# Patient Record
Sex: Male | Born: 1985 | Race: Black or African American | Hispanic: No | Marital: Single | Smoking: Former smoker
Health system: Southern US, Community
[De-identification: ages and names within clinical notes are randomized; demographics above are authoritative.]

## PROBLEM LIST (undated history)

## (undated) ENCOUNTER — Emergency Department (HOSPITAL_COMMUNITY): Discharge status: Absent without leave | Payer: Self-pay

## (undated) DIAGNOSIS — F329 Major depressive disorder, single episode, unspecified: Secondary | ICD-10-CM

## (undated) DIAGNOSIS — F32A Depression, unspecified: Secondary | ICD-10-CM

## (undated) HISTORY — DX: Major depressive disorder, single episode, unspecified: F32.9

## (undated) HISTORY — DX: Depression, unspecified: F32.A

---
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
## 2006-11-08 ENCOUNTER — Emergency Department (HOSPITAL_COMMUNITY): Admission: EM | Admit: 2006-11-08 | Discharge: 2006-11-08 | Payer: Self-pay | Admitting: Family Medicine

## 2011-01-15 ENCOUNTER — Inpatient Hospital Stay (HOSPITAL_COMMUNITY)
Admission: RE | Admit: 2011-01-15 | Discharge: 2011-01-15 | Disposition: A | Discharge status: Home / Self Care | Payer: Federal, State, Local not specified - PPO | Source: Ambulatory Visit | Attending: Emergency Medicine | Admitting: Emergency Medicine

## 2011-01-24 ENCOUNTER — Inpatient Hospital Stay (INDEPENDENT_AMBULATORY_CARE_PROVIDER_SITE_OTHER)
Admission: RE | Admit: 2011-01-24 | Discharge: 2011-01-24 | Disposition: A | Discharge status: Home / Self Care | Payer: Federal, State, Local not specified - PPO | Source: Ambulatory Visit | Attending: Family Medicine | Admitting: Family Medicine

## 2011-01-24 DIAGNOSIS — R109 Unspecified abdominal pain: Secondary | ICD-10-CM

## 2011-01-24 LAB — POCT URINALYSIS DIP (DEVICE)
Hgb urine dipstick: NEGATIVE
Ketones, ur: NEGATIVE mg/dL
Protein, ur: 30 mg/dL — AB
Specific Gravity, Urine: 1.025 (ref 1.005–1.030)

## 2015-09-18 ENCOUNTER — Emergency Department (HOSPITAL_COMMUNITY)
Admission: EM | Admit: 2015-09-18 | Discharge: 2015-09-18 | Disposition: A | Discharge status: Home / Self Care | Payer: Worker's Compensation | Attending: Emergency Medicine | Admitting: Emergency Medicine

## 2015-09-18 ENCOUNTER — Encounter (HOSPITAL_COMMUNITY): Payer: Self-pay | Admitting: Neurology

## 2015-09-18 DIAGNOSIS — F17200 Nicotine dependence, unspecified, uncomplicated: Secondary | ICD-10-CM | POA: Insufficient documentation

## 2015-09-18 DIAGNOSIS — S199XXA Unspecified injury of neck, initial encounter: Secondary | ICD-10-CM | POA: Insufficient documentation

## 2015-09-18 DIAGNOSIS — X58XXXA Exposure to other specified factors, initial encounter: Secondary | ICD-10-CM | POA: Insufficient documentation

## 2015-09-18 DIAGNOSIS — Y990 Civilian activity done for income or pay: Secondary | ICD-10-CM | POA: Insufficient documentation

## 2015-09-18 DIAGNOSIS — S39012A Strain of muscle, fascia and tendon of lower back, initial encounter: Secondary | ICD-10-CM | POA: Insufficient documentation

## 2015-09-18 DIAGNOSIS — Y92242 Post office as the place of occurrence of the external cause: Secondary | ICD-10-CM | POA: Insufficient documentation

## 2015-09-18 DIAGNOSIS — Y9389 Activity, other specified: Secondary | ICD-10-CM | POA: Insufficient documentation

## 2015-09-18 DIAGNOSIS — S29019A Strain of muscle and tendon of unspecified wall of thorax, initial encounter: Secondary | ICD-10-CM | POA: Insufficient documentation

## 2015-09-18 MED ORDER — IBUPROFEN 600 MG PO TABS: 600.0000 mg | ORAL_TABLET | Freq: Four times a day (QID) | ORAL | Status: DC | PRN

## 2015-09-18 MED ORDER — CYCLOBENZAPRINE HCL 10 MG PO TABS: 10.0000 mg | ORAL_TABLET | Freq: Two times a day (BID) | ORAL | Status: DC | PRN

## 2015-09-18 NOTE — ED Notes (Signed)
Called no answer

## 2015-09-18 NOTE — ED Provider Notes (Signed)
CSN: [id number]     Arrival date & time 09/18/15  1220 History  .By signing my name below, I, Dennis Moreno, attest that this documentation has been prepared under the direction and in the presence of Dennis Horseman, PA-C. Electronically Signed: Charline Moreno, ED Scribe 09/18/2015 at 2:24 PM.   Chief Complaint  Patient presents with  . Neck Pain   The history is provided by the patient.   HPI Comments: Dennis Moreno is a 30 y.o. male who presents to the Emergency Department with a chief complaint of gradually improving, intermittent neck pain onset 12 days ago. Pt states that he was working [place] the [employer] office on 09/06/15 and pushing a large wire box full of magazines when he felt a sudden, sharp sensation in his left neck and mid back. Pt states that constant pain has improved but he still experiences pain with movement. No medications tried PTA. He denies neck stiffness at this time.   History reviewed. No pertinent past medical history. History reviewed. No pertinent past surgical history. No family history on file. Social History  Substance Use Topics  . Smoking status: Current Every Day Smoker  . Smokeless tobacco: None  . Alcohol Use: Yes    Review of Systems  Constitutional: Negative for fever.  Musculoskeletal: Positive for neck pain. Negative for neck stiffness.   Allergies  Review of patient's allergies indicates no known allergies.  Home Medications   Prior to Admission medications   Not on File   BP 118/68 mmHg  Pulse 74  Temp(Src) 98 F (36.7 C) (Oral)  Resp 14  SpO2 100% Physical Exam  Constitutional: He is oriented to person, place, and time. He appears well-developed and well-nourished. No distress.  HENT:  Head: Normocephalic and atraumatic.  Eyes: Conjunctivae and EOM are normal. Right eye exhibits no discharge. Left eye exhibits no discharge. No scleral icterus.  Neck: Normal range of motion. Neck supple. No tracheal deviation present.   Cardiovascular: Normal rate, regular rhythm and normal heart sounds.  Exam reveals no gallop and no friction rub.   No murmur heard. Pulmonary/Chest: Effort normal and breath sounds normal. No respiratory distress. He has no wheezes.  Abdominal: Soft. He exhibits no distension. There is no tenderness.  Musculoskeletal: Normal range of motion.  Lumbar and thoracic paraspinal muscles tender to palpation, no bony tenderness, step-offs, or gross abnormality or deformity of spine, patient is able to ambulate, moves all extremities  Bilateral great toe extension intact Bilateral plantar/dorsiflexion intact  Neurological: He is alert and oriented to person, place, and time.  Sensation and strength intact bilaterally   Skin: Skin is warm and dry. He is not diaphoretic.  Psychiatric: He has a normal mood and affect. His behavior is normal. Judgment and thought content normal.  Nursing note and vitals reviewed.  ED Course  Procedures (including critical care time) DIAGNOSTIC STUDIES: Oxygen Saturation is 100% on RA, normal by my interpretation.    COORDINATION OF CARE: 2:11 PM-Discussed treatment plan which includes Flexeril and ibuprofen and follow up with a specialist if needed with pt at bedside and pt agreed to plan.     MDM   Final diagnoses:  Back strain, initial encounter    Patient with back pain.  No neurological deficits and normal neuro exam.  Patient is ambulatory.  No loss of bowel or bladder control.  Doubt cauda equina.  Denies fever,  doubt epidural abscess or other lesion. Recommend back exercises, stretching, RICE, and will treat with a  short course of ibuprofen and Flexeril.  Patient states that his symptoms have been gradually improving. However, when he moves in certain directions he still feels pain.  Encouraged the patient that there could be a need for additional workup and/or imaging such as MRI, if the symptoms do not resolve. Patient advised that if the back  pain does not resolve, or radiates, this could progress to more serious conditions and is encouraged to follow-up with PCP or orthopedics within 2 weeks.     I personally performed the services described in this documentation, which was scribed in my presence. The recorded information has been reviewed and is accurate.     Dennis Horsemanobert Shoshanah Dapper, PA-C 09/18/15 1431  Dennis Guiseana Duo Liu, MD 09/19/15 [phone]

## 2015-09-18 NOTE — ED Notes (Addendum)
Pt reports on 12/24, he was working at the [employer] office, and was pushing a wire box bundle of magazines. He then had pain to right and left side of neck and right and left middle back. Pain is better but still feels it when he moves in center directions. This is workman's comp.

## 2015-09-18 NOTE — Discharge Instructions (Signed)
Back Strain With Rehab A strain is an injury in which a tendon or muscle is torn. The muscles and tendons of the back are vulnerable to strains. However, these muscles and tendons are very strong and require a great force to be injured. Strains are classified into three categories. Grade 1 strains cause pain, but the tendon is not lengthened. Grade 2 strains include a lengthened ligament, due to the ligament being stretched or partially ruptured. With grade 2 strains there is still function, although the function may be decreased. Grade 3 strains involve a complete tear of the tendon or muscle, and function is usually impaired. SYMPTOMS   Pain in the lower back.  Pain that affects one side more than the other.  Pain that gets worse with movement and may be felt in the hip, buttocks, or back of the thigh.  Muscle spasms of the muscles in the back.  Swelling along the muscles of the back.  Loss of strength of the back muscles.  Crackling sound (crepitation) when the muscles are touched. CAUSES  Lower back strains occur when a force is placed on the muscles or tendons that is greater than they can handle. Common causes of injury include:  Prolonged overuse of the muscle-tendon units in the lower back, usually from incorrect posture.  A single violent injury or force applied to the back. RISK INCREASES WITH:  Sports that involve twisting forces on the spine or a lot of bending at the waist (football, rugby, weightlifting, bowling, golf, tennis, speed skating, racquetball, swimming, running, gymnastics, diving).  Poor strength and flexibility.  Failure to warm up properly before activity.  Family history of lower back pain or disk disorders.  Previous back injury or surgery (especially fusion).  Poor posture with lifting, especially heavy objects.  Prolonged sitting, especially with poor posture. PREVENTION   Learn and use proper posture when sitting or lifting (maintain proper  posture when sitting, lift using the knees and legs, not at the waist).  Warm up and stretch properly before activity.  Allow for adequate recovery between workouts.  Maintain physical fitness:  Strength, flexibility, and endurance.  Cardiovascular fitness. PROGNOSIS  If treated properly, lower back strains usually heal within 6 weeks. RELATED COMPLICATIONS   Recurring symptoms, resulting in a chronic problem.  Chronic inflammation, scarring, and partial muscle-tendon tear.  Delayed healing or resolution of symptoms.  Prolonged disability. TREATMENT  Treatment first involves the use of ice and medicine, to reduce pain and inflammation. The use of strengthening and stretching exercises may help reduce pain with activity. These exercises may be performed at home or with a therapist. Severe injuries may require referral to a therapist for further evaluation and treatment, such as ultrasound. Your caregiver may advise that you wear a back brace or corset, to help reduce pain and discomfort. Often, prolonged bed rest results in greater harm then benefit. Corticosteroid injections may be recommended. However, these should be reserved for the most serious cases. It is important to avoid using your back when lifting objects. At night, sleep on your back on a firm mattress with a pillow placed under your knees. If non-surgical treatment is unsuccessful, surgery may be needed.  MEDICATION   If pain medicine is needed, nonsteroidal anti-inflammatory medicines (aspirin and ibuprofen), or other minor pain relievers (acetaminophen), are often advised.  Do not take pain medicine for 7 days before surgery.  Prescription pain relievers may be given, if your caregiver thinks they are needed. Use only as directed and  only as much as you need.  Ointments applied to the skin may be helpful.  Corticosteroid injections may be given by your caregiver. These injections should be reserved for the most  serious cases, because they may only be given a certain number of times. HEAT AND COLD  Cold treatment (icing) should be applied for 10 to 15 minutes every 2 to 3 hours for inflammation and pain, and immediately after activity that aggravates your symptoms. Use ice packs or an ice massage.  Heat treatment may be used before performing stretching and strengthening activities prescribed by your caregiver, physical therapist, or athletic trainer. Use a heat pack or a warm water soak. SEEK MEDICAL CARE IF:   Symptoms get worse or do not improve in 2 to 4 weeks, despite treatment.  You develop numbness, weakness, or loss of bowel or bladder function.  New, unexplained symptoms develop. (Drugs used in treatment may produce side effects.) EXERCISES  RANGE OF MOTION (ROM) AND STRETCHING EXERCISES - Low Back Strain Most people with lower back pain will find that their symptoms get worse with excessive bending forward (flexion) or arching at the lower back (extension). The exercises which will help resolve your symptoms will focus on the opposite motion.  Your physician, physical therapist or athletic trainer will help you determine which exercises will be most helpful to resolve your lower back pain. Do not complete any exercises without first consulting with your caregiver. Discontinue any exercises which make your symptoms worse until you speak to your caregiver.  If you have pain, numbness or tingling which travels down into your buttocks, leg or foot, the goal of the therapy is for these symptoms to move closer to your back and eventually resolve. Sometimes, these leg symptoms will get better, but your lower back pain may worsen. This is typically an indication of progress in your rehabilitation. Be very alert to any changes in your symptoms and the activities in which you participated in the 24 hours prior to the change. Sharing this information with your caregiver will allow him/her to most efficiently  treat your condition.  These exercises may help you when beginning to rehabilitate your injury. Your symptoms may resolve with or without further involvement from your physician, physical therapist or athletic trainer. While completing these exercises, remember:  Restoring tissue flexibility helps normal motion to return to the joints. This allows healthier, less painful movement and activity.  An effective stretch should be held for at least 30 seconds.  A stretch should never be painful. You should only feel a gentle lengthening or release in the stretched tissue. FLEXION RANGE OF MOTION AND STRETCHING EXERCISES: STRETCH - Flexion, Single Knee to Chest   Lie on a firm bed or floor with both legs extended in front of you.  Keeping one leg in contact with the floor, bring your opposite knee to your chest. Hold your leg in place by either grabbing behind your thigh or at your knee.  Pull until you feel a gentle stretch in your lower back. Hold __________ seconds.  Slowly release your grasp and repeat the exercise with the opposite side. Repeat __________ times. Complete this exercise __________ times per day.  STRETCH - Flexion, Double Knee to Chest   Lie on a firm bed or floor with both legs extended in front of you.  Keeping one leg in contact with the floor, bring your opposite knee to your chest.  Tense your stomach muscles to support your back and then lift your  other knee to your chest. Hold your legs in place by either grabbing behind your thighs or at your knees.  Pull both knees toward your chest until you feel a gentle stretch in your lower back. Hold __________ seconds.  Tense your stomach muscles and slowly return one leg at a time to the floor. Repeat __________ times. Complete this exercise __________ times per day.  STRETCH - Low Trunk Rotation  Lie on a firm bed or floor. Keeping your legs in front of you, bend your knees so they are both pointed toward the ceiling  and your feet are flat on the floor.  Extend your arms out to the side. This will stabilize your upper body by keeping your shoulders in contact with the floor.  Gently and slowly drop both knees together to one side until you feel a gentle stretch in your lower back. Hold for __________ seconds.  Tense your stomach muscles to support your lower back as you bring your knees back to the starting position. Repeat the exercise to the other side. Repeat __________ times. Complete this exercise __________ times per day  EXTENSION RANGE OF MOTION AND FLEXIBILITY EXERCISES: STRETCH - Extension, Prone on Elbows   Lie on your stomach on the floor, a bed will be too soft. Place your palms about shoulder width apart and at the height of your head.  Place your elbows under your shoulders. If this is too painful, stack pillows under your chest.  Allow your body to relax so that your hips drop lower and make contact more completely with the floor.  Hold this position for __________ seconds.  Slowly return to lying flat on the floor. Repeat __________ times. Complete this exercise __________ times per day.  RANGE OF MOTION - Extension, Prone Press Ups  Lie on your stomach on the floor, a bed will be too soft. Place your palms about shoulder width apart and at the height of your head.  Keeping your back as relaxed as possible, slowly straighten your elbows while keeping your hips on the floor. You may adjust the placement of your hands to maximize your comfort. As you gain motion, your hands will come more underneath your shoulders.  Hold this position __________ seconds.  Slowly return to lying flat on the floor. Repeat __________ times. Complete this exercise __________ times per day.  RANGE OF MOTION- Quadruped, Neutral Spine   Assume a hands and knees position on a firm surface. Keep your hands under your shoulders and your knees under your hips. You may place padding under your knees for  comfort.  Drop your head and point your tail bone toward the ground below you. This will round out your lower back like an angry cat. Hold this position for __________ seconds.  Slowly lift your head and release your tail bone so that your back sags into a large arch, like an old horse.  Hold this position for __________ seconds.  Repeat this until you feel limber in your lower back.  Now, find your "sweet spot." This will be the most comfortable position somewhere between the two previous positions. This is your neutral spine. Once you have found this position, tense your stomach muscles to support your lower back.  Hold this position for __________ seconds. Repeat __________ times. Complete this exercise __________ times per day.  STRENGTHENING EXERCISES - Low Back Strain These exercises may help you when beginning to rehabilitate your injury. These exercises should be done near your "sweet spot." This  is the neutral, low-back arch, somewhere between fully rounded and fully arched, that is your least painful position. When performed in this safe range of motion, these exercises can be used for people who have either a flexion or extension based injury. These exercises may resolve your symptoms with or without further involvement from your physician, physical therapist or athletic trainer. While completing these exercises, remember:   Muscles can gain both the endurance and the strength needed for everyday activities through controlled exercises.  Complete these exercises as instructed by your physician, physical therapist or athletic trainer. Increase the resistance and repetitions only as guided.  You may experience muscle soreness or fatigue, but the pain or discomfort you are trying to eliminate should never worsen during these exercises. If this pain does worsen, stop and make certain you are following the directions exactly. If the pain is still present after adjustments, discontinue the  exercise until you can discuss the trouble with your caregiver. STRENGTHENING - Deep Abdominals, Pelvic Tilt  Lie on a firm bed or floor. Keeping your legs in front of you, bend your knees so they are both pointed toward the ceiling and your feet are flat on the floor.  Tense your lower abdominal muscles to press your lower back into the floor. This motion will rotate your pelvis so that your tail bone is scooping upwards rather than pointing at your feet or into the floor.  With a gentle tension and even breathing, hold this position for __________ seconds. Repeat __________ times. Complete this exercise __________ times per day.  STRENGTHENING - Abdominals, Crunches   Lie on a firm bed or floor. Keeping your legs in front of you, bend your knees so they are both pointed toward the ceiling and your feet are flat on the floor. Cross your arms over your chest.  Slightly tip your chin down without bending your neck.  Tense your abdominals and slowly lift your trunk high enough to just clear your shoulder blades. Lifting higher can put excessive stress on the lower back and does not further strengthen your abdominal muscles.  Control your return to the starting position. Repeat __________ times. Complete this exercise __________ times per day.  STRENGTHENING - Quadruped, Opposite UE/LE Lift   Assume a hands and knees position on a firm surface. Keep your hands under your shoulders and your knees under your hips. You may place padding under your knees for comfort.  Find your neutral spine and gently tense your abdominal muscles so that you can maintain this position. Your shoulders and hips should form a rectangle that is parallel with the floor and is not twisted.  Keeping your trunk steady, lift your right hand no higher than your shoulder and then your left leg no higher than your hip. Make sure you are not holding your breath. Hold this position __________ seconds.  Continuing to keep your  abdominal muscles tense and your back steady, slowly return to your starting position. Repeat with the opposite arm and leg. Repeat __________ times. Complete this exercise __________ times per day.  STRENGTHENING - Lower Abdominals, Double Knee Lift  Lie on a firm bed or floor. Keeping your legs in front of you, bend your knees so they are both pointed toward the ceiling and your feet are flat on the floor.  Tense your abdominal muscles to brace your lower back and slowly lift both of your knees until they come over your hips. Be certain not to hold your breath.  Hold  __________ seconds. Using your abdominal muscles, return to the starting position in a slow and controlled manner. Repeat __________ times. Complete this exercise __________ times per day.  POSTURE AND BODY MECHANICS CONSIDERATIONS - Low Back Strain Keeping correct posture when sitting, standing or completing your activities will reduce the stress put on different body tissues, allowing injured tissues a chance to heal and limiting painful experiences. The following are general guidelines for improved posture. Your physician or physical therapist will provide you with any instructions specific to your needs. While reading these guidelines, remember:  The exercises prescribed by your provider will help you have the flexibility and strength to maintain correct postures.  The correct posture provides the best environment for your joints to work. All of your joints have less wear and tear when properly supported by a spine with good posture. This means you will experience a healthier, less painful body.  Correct posture must be practiced with all of your activities, especially prolonged sitting and standing. Correct posture is as important when doing repetitive low-stress activities (typing) as it is when doing a single heavy-load activity (lifting). RESTING POSITIONS Consider which positions are most painful for you when choosing a  resting position. If you have pain with flexion-based activities (sitting, bending, stooping, squatting), choose a position that allows you to rest in a less flexed posture. You would want to avoid curling into a fetal position on your side. If your pain worsens with extension-based activities (prolonged standing, working overhead), avoid resting in an extended position such as sleeping on your stomach. Most people will find more comfort when they rest with their spine in a more neutral position, neither too rounded nor too arched. Lying on a non-sagging bed on your side with a pillow between your knees, or on your back with a pillow under your knees will often provide some relief. Keep in mind, being in any one position for a prolonged period of time, no matter how correct your posture, can still lead to stiffness. PROPER SITTING POSTURE In order to minimize stress and discomfort on your spine, you must sit with correct posture. Sitting with good posture should be effortless for a healthy body. Returning to good posture is a gradual process. Many people can work toward this most comfortably by using various supports until they have the flexibility and strength to maintain this posture on their own. When sitting with proper posture, your ears will fall over your shoulders and your shoulders will fall over your hips. You should use the back of the chair to support your upper back. Your lower back will be in a neutral position, just slightly arched. You may place a small pillow or folded towel at the base of your lower back for support.  When working at a desk, create an environment that supports good, upright posture. Without extra support, muscles tire, which leads to excessive strain on joints and other tissues. Keep these recommendations in mind: CHAIR:  A chair should be able to slide under your desk when your back makes contact with the back of the chair. This allows you to work closely.  The chair's  height should allow your eyes to be level with the upper part of your monitor and your hands to be slightly lower than your elbows. BODY POSITION  Your feet should make contact with the floor. If this is not possible, use a foot rest.  Keep your ears over your shoulders. This will reduce stress on your neck and lower  back. INCORRECT SITTING POSTURES  If you are feeling tired and unable to assume a healthy sitting posture, do not slouch or slump. This puts excessive strain on your back tissues, causing more damage and pain. Healthier options include:  Using more support, like a lumbar pillow.  Switching tasks to something that requires you to be upright or walking.  Talking a brief walk.  Lying down to rest in a neutral-spine position. PROLONGED STANDING WHILE SLIGHTLY LEANING FORWARD  When completing a task that requires you to lean forward while standing in one place for a long time, place either foot up on a stationary 2-4 inch high object to help maintain the best posture. When both feet are on the ground, the lower back tends to lose its slight inward curve. If this curve flattens (or becomes too large), then the back and your other joints will experience too much stress, tire more quickly, and can cause pain. CORRECT STANDING POSTURES Proper standing posture should be assumed with all daily activities, even if they only take a few moments, like when brushing your teeth. As in sitting, your ears should fall over your shoulders and your shoulders should fall over your hips. You should keep a slight tension in your abdominal muscles to brace your spine. Your tailbone should point down to the ground, not behind your body, resulting in an over-extended swayback posture.  INCORRECT STANDING POSTURES  Common incorrect standing postures include a forward head, locked knees and/or an excessive swayback. WALKING Walk with an upright posture. Your ears, shoulders and hips should all  line-up. PROLONGED ACTIVITY IN A FLEXED POSITION When completing a task that requires you to bend forward at your waist or lean over a low surface, try to find a way to stabilize 3 out of 4 of your limbs. You can place a hand or elbow on your thigh or rest a knee on the surface you are reaching across. This will provide you more stability so that your muscles do not fatigue as quickly. By keeping your knees relaxed, or slightly bent, you will also reduce stress across your lower back. CORRECT LIFTING TECHNIQUES DO :   Assume a wide stance. This will provide you more stability and the opportunity to get as close as possible to the object which you are lifting.  Tense your abdominals to brace your spine. Bend at the knees and hips. Keeping your back locked in a neutral-spine position, lift using your leg muscles. Lift with your legs, keeping your back straight.  Test the weight of unknown objects before attempting to lift them.  Try to keep your elbows locked down at your sides in order get the best strength from your shoulders when carrying an object.  Always ask for help when lifting heavy or awkward objects. INCORRECT LIFTING TECHNIQUES DO NOT:   Lock your knees when lifting, even if it is a small object.  Bend and twist. Pivot at your feet or move your feet when needing to change directions.  Assume that you can safely pick up even a paper clip without proper posture.   This information is not intended to replace advice given to you by your health care provider. Make sure you discuss any questions you have with your health care provider.   Document Released: 08/30/2005 Document Revised: 09/20/2014 Document Reviewed: 12/12/2008 [employer] Interactive Patient Education [employer]

## 2015-09-18 NOTE — ED Notes (Signed)
Declined W/C at D/C and was escorted to lobby by RN. 

## 2015-09-29 ENCOUNTER — Emergency Department (HOSPITAL_COMMUNITY)
Admission: EM | Admit: 2015-09-29 | Discharge: 2015-09-29 | Disposition: A | Discharge status: Home / Self Care | Payer: Federal, State, Local not specified - PPO | Attending: Emergency Medicine | Admitting: Emergency Medicine

## 2015-09-29 ENCOUNTER — Encounter (HOSPITAL_COMMUNITY): Payer: Self-pay | Admitting: *Deleted

## 2015-09-29 DIAGNOSIS — X500XXD Overexertion from strenuous movement or load, subsequent encounter: Secondary | ICD-10-CM | POA: Insufficient documentation

## 2015-09-29 DIAGNOSIS — S238XXD Sprain of other specified parts of thorax, subsequent encounter: Secondary | ICD-10-CM | POA: Insufficient documentation

## 2015-09-29 DIAGNOSIS — F17200 Nicotine dependence, unspecified, uncomplicated: Secondary | ICD-10-CM | POA: Insufficient documentation

## 2015-09-29 DIAGNOSIS — S161XXD Strain of muscle, fascia and tendon at neck level, subsequent encounter: Secondary | ICD-10-CM | POA: Diagnosis not present

## 2015-09-29 DIAGNOSIS — S239XXD Sprain of unspecified parts of thorax, subsequent encounter: Secondary | ICD-10-CM

## 2015-09-29 DIAGNOSIS — M542 Cervicalgia: Secondary | ICD-10-CM | POA: Diagnosis present

## 2015-09-29 MED ORDER — METHOCARBAMOL 500 MG PO TABS: 500.0000 mg | ORAL_TABLET | Freq: Two times a day (BID) | ORAL | Status: DC | PRN

## 2015-09-29 MED ORDER — IBUPROFEN 400 MG PO TABS
800.0000 mg | ORAL_TABLET | Freq: Once | ORAL | Status: AC
  Administered 2015-09-29: 800 mg via ORAL
  Filled 2015-09-29: qty 2

## 2015-09-29 NOTE — ED Notes (Signed)
Declined W/C at D/C and was escorted to lobby by RN. 

## 2015-09-29 NOTE — Discharge Instructions (Signed)
For pain control you may take up to 800mg  of Motrin (also known as ibuprofen). That is usually 4 over the counter pills,  3 times a day. Take with food to minimize stomach irritation   For breakthrough pain you may take Robaxin. Do not drink alcohol, drive or operate heavy machinery when taking Robaxin.  Do not hesitate to return to the emergency room for any new, worsening or concerning symptoms.  Please obtain primary care using resource guide below. Let them know that you were seen in the emergency room and that they will need to obtain records for further outpatient management.    Emergency Department Resource Guide 1) Find a Doctor and Pay Out of Pocket Although you won't have to find out who is covered by your insurance plan, it is a good idea to ask around and get recommendations. You will then need to call the office and see if the doctor you have chosen will accept you as a new patient and what types of options they offer for patients who are self-pay. Some doctors offer discounts or will set up payment plans for their patients who do not have insurance, but you will need to ask so you aren't surprised when you get to your appointment.  2) Contact Your Local Health Department Not all health departments have doctors that can see patients for sick visits, but many do, so it is worth a call to see if yours does. If you don't know where your local health department is, you can check in your phone book. The CDC also has a tool to help you locate your state's health department, and many state websites also have listings of all of their local health departments.  3) Find a Walk-in Clinic If your illness is not likely to be very severe or complicated, you may want to try a walk in clinic. These are popping up all over the country in pharmacies, drugstores, and shopping centers. They're usually staffed by nurse practitioners or physician assistants that have been trained to treat common illnesses  and complaints. They're usually fairly quick and inexpensive. However, if you have serious medical issues or chronic medical problems, these are probably not your best option.  No Primary Care Doctor: - Call Health Connect at  [phone] - they can help you locate a primary care doctor that  accepts your insurance, provides certain services, etc. - Physician Referral Service- [phone]  Chronic Pain Problems: Organization         Address  Phone   Notes  [facility]  [phone] Patients need to be referred by their primary care doctor.   Medication Assistance: Organization         Address  Phone   Notes  [facility]ication [facility] [address] [place], [place] [address] [phone] --Must be a resident of [place] -- Must have NO insurance coverage whatsoever (no Medicaid/ Medicare, etc.) -- The pt. MUST have a primary care doctor that directs their care regularly and follows them in the community   MedAssist  [phone]   [employer]  [phone]    Agencies that provide inexpensive medical care: Organization         Address  Phone   Notes  [facility]  [phone]   [facility]    [phone]   [facility] [address] [place], [place] [address] [phone]  [facility] [address] [place] [phone]   Planned Parenthood    [phone]   [facility]    [phone]   [place] and [place] [address], [place] Phone:  [phone], Fax:  [phone] Hours of Operation:  9 am - 6 pm, M-F.  Also accepts Medicaid/Medicare and self-pay.  [facility] for [place] [address], [address], [place] Phone: [phone], Fax: [phone]. Hours of Operation:  8:30 am - 5:30 pm, M-F.  Also accepts Medicaid and self-pay.  [facility] [place] [address] [place] Phone: [phone]   [place], [place], [place] [phone], Ext. [phone] Mondays & Thursdays: 7-9 AM.  First 15 patients are seen on a first come, first serve basis.    [place] Providers:  Organization         Address  Phone   Notes  [facility] [address], [place] [phone] Also accepts self-pay patients.  [facility] [address], [place]  [phone]   [place], [address], [place] [phone]   [facility] [address], [place] [phone]   Lucianne Lei [address], [place]   [phone] Only accepts [place] Access [place] patients after they have their name applied to their card.   Self-Pay (no insurance) in [facility] [place]:  Organization         Address  Phone   Notes  Sickle Cell Patients, [facility] [place] [phone]   [facility] [place] [phone]   [name] [place]  [place], [place], [place] [phone]   [facility]/Dr. Osei-Bonsu  [address] [place] or [address], [place] [phone] Phone number for both [place] and [place] locations is the same.  Urgent Medical and [facility] [address] [place] [phone]   [facility] [address] [place] or [address] [phone]   [facility] [address], [place] [phone], phone; [phone], fax Sees patients 1st and 3rd Saturday of every month.  Must not qualify for public or private insurance (i.e. Medicaid, Medicare, [place] Health Choice, Veterans' Benefits)  Household income should be no more than 200% of the poverty level  The clinic cannot treat you if you are pregnant or think you are pregnant  Sexually transmitted diseases are not treated at the clinic.    Dental Care: Organization         Address  Phone  Notes  [facility] [place] [phone] Accepts children up to age 97 who are enrolled in [place] or [place]; pregnant women with a Medicaid card; and children who have applied for Medicaid or [place] Health Choice, but were declined, whose parents can pay a reduced fee at time of service.  [facility]  [address], [place] [phone] Accepts children up to age 11 who are enrolled in [place] or [place]; pregnant women with a Medicaid card; and  children who have applied for Medicaid or [place] Health Choice, but were declined, whose parents can pay a reduced fee at time of service.  [place] Adult [facility] Access PROGRAM  [place] [phone] Patients are seen by appointment only. Walk-ins are not accepted. [place] will see patients 65 years of age and older. Monday - Tuesday (8am-5pm) Most Wednesdays (8:30-5pm) $30 per visit, cash only  [facility] [place] Access PRO[facility]  [address], [facility] [phone] Patients are seen by appointment only. Walk-ins are not accepted. [place] will see patients 17 years of age and older. One Wednesday Evening (Monthly: Volunteer Based).  $30 per visit, cash only  [place]  [phone] for adults; Children under age 53, call Graduate Pediatric Dentistry at [phone]. Children aged 31-14, please call [phone] to request a pediatric application.  Dental services are provided in all areas of dental care including fillings, crowns and bridges, complete and partial dentures, implants, gum treatment, root canals, and extractions. Preventive care is  also provided. Treatment is provided to both adults and children. Patients are selected via a lottery and there is often a waiting list.   [facility] [address] [place]  [phone] [email or web address]   [facility] [address] [place], [place] [phone], Ext. [phone] Second and Fourth Thursday of each month, opens at 6:30 AM; Clinic ends at 9 AM.  Patients are seen on a first-come first-served basis, and a limited number are seen during each clinic.   [facility]  [address] [place] D[place]er [place], [place] [phone]   Eligibility Requirements You must have lived in [place], [place], or [place] counties for at least the last three months.   You cannot be eligible for state or federal sponsored [employer], including [employer], [place], or [employer].   You generally cannot be eligible for healthcare insurance through your employer.    How to apply: Eligibility screenings are held every Tuesday and Wednesday afternoon from 1:00 pm until 4:00 pm. You do not need an appointment for the interview!  [facility] [address] [place], [place]   [place]  [place] Department  [place]  [phone]    Behavioral Health Resources in the Community: Intensive Outpatient Programs Organization         Address  Phone  Notes  [place]. [address] [place], [place] [phone]   [facility]patient [address], [place], [place]   ADS: Alcohol & Drug Svcs [address] [place], [place]   [place] [address],  [place], [place] or [phone]   Substance Abuse Resources Organization         Address  Phone  Notes  Alcohol and Drug Services  [phone]   [place]  [phone]   The [place]   Chinita Pester  [phone]   Residential & Outpatient Substance Abuse Program  [phone]   Psychological Services Organization         Address  Phone  Notes  [facility] [place]  [place]  [phone]   [place] [address], [place] or [phone]    Mobile Crisis Teams Organization  Address  Phone  Notes  [facility], Mobile Crisis Care Unit  [phone]   [facility]  [address] [place], [place] [phone]   [facility] [address] [place] [phone]    Self-Help/Support Groups Organization         Address  Phone             Notes  [facility] of [place] - variety of support groups  [phone] Call for more information  Narcotics Anonymous (NA), Caring [facility] [address], [place]  2 meetings at this location   Statisticianesidential Treatment Programs Organization         Address  Phone  Notes  [facility] [address],    [place]  [phone]   [facility]  [address], [place] [phone], [place], [place] [phone]   [facility] [address], [place] [phone] Admissions: 8am-3pm M-F  [facility] [address]    [place], [place] [phone]   [facility] [address], [place], [place] [phone]   [facility] [address]  [place], [place] [phone]   [facility] - Intensive Outpatient [address], [place], [place] [phone]   [facility] ([facility] Recovery [facility] [address]  [place], [place] [phone] or [phone]   [facility] (RTS) [address] [place], [place] [phone] Accepts Medicaid  [facility] [place] [address]  [place] [phone]  Substance Abuse/Addiction Treatment   [facility] Behavioral Health Resources Organization         Address  Phone  Notes  [facility]  [phone](888) [phone]   Angie FavaJulie Brannon, PhD [address] [place], [place]   [phone] or [phone]) [phone]   [facility] [place]   [address] [place], [place] [phone](336) [phone]   [facility] [address], [place], [place] [phone]) [phone] Insurance/Medicaid/sponsorship through [facility]  [facility] Families [address]                                    [place], [place] [phone]) [phone] Therapy/tele-psych/case  [facility] [address].   [place], [place] [phone]) [phone]    Dr. Lolly MustacheArfeen  [phone]) [phone]   [facility] [place]  United Way [facility] 1) [address], [place] 2) [address], [place] 3)  [address], [place] [phone]) [phone]  [phone](336) [phone]   [facility] Child Abuse Hotline [phone]) [phone] or [phone]) [phone] (After Hours)

## 2015-09-29 NOTE — ED Notes (Signed)
PT reports ongoing pain to nedck and back. Pt reports Pain first started 3-4 weeks ago . Pt reports he injured his neck and back at work . Pain is 8/10 at rest and increases to 10/10 with activity. Py took a week off work but pain has not improved.

## 2015-09-29 NOTE — ED Provider Notes (Signed)
History  By signing my name below, I, Karle PlumberJennifer Tensley, attest that this documentation has been prepared under the direction and in the presence of [employer] States [employer] Corporationicole Leeona Mccardle, PA-C. Electronically Signed: Karle PlumberJennifer Tensley, ED Scribe. 09/29/2015. 9:58 AM.  Chief Complaint  Patient presents with  . Back Pain  . Neck Pain   The history is provided by the patient and medical records. No language interpreter was used.    HPI Comments:  Dennis Moreno is a 30 y.o. male who presents to the Emergency Department complaining of worsening neck and mid back pain that started secondary to moving a heavy wire cage 3-4 weeks ago while at work. He states the cage weighs about 1000 pounds and is on wheels. He states he was pushing and pulling the cage trying to move it. He was seen here about 11 days ago and was given a referral to the orthopedist but has not been able to follow up due to his job not giving him his claim number for worker's comp. He rates the pain at 3-5/10 at rest and 10/10 when active. He has not been taking anything for pain but tries to use yoga to reduce the pain. He denies modifying factors. He denies numbness, tingling or weakness of any extremity, nausea, vomiting, fever or chills.   History reviewed. No pertinent past medical history. History reviewed. No pertinent past surgical history. History reviewed. No pertinent family history. Social History  Substance Use Topics  . Smoking status: Current Every Day Smoker  . Smokeless tobacco: None  . Alcohol Use: Yes    Review of Systems A complete 10 system review of systems was obtained and all systems are negative except as noted in the HPI and PMH.   Allergies  Review of patient's allergies indicates no known allergies.  Home Medications   Prior to Admission medications   Medication Sig Start Date End Date Taking? Authorizing Provider  cyclobenzaprine (FLEXERIL) 10 MG tablet Take 1 tablet (10 mg total) by mouth 2 (two) times daily  as needed for muscle spasms. 09/18/15  Yes Roxy Horsemanobert Browning, PA-C  ibuprofen (ADVIL,MOTRIN) 600 MG tablet Take 1 tablet (600 mg total) by mouth every 6 (six) hours as needed. 09/18/15  Yes Roxy Horsemanobert Browning, PA-C   Triage Vitals: BP 114/55 mmHg  Pulse 74  Temp(Src) 98.6 F (37 C) (Oral)  Resp 16  Ht 5\' 9"  (1.753 m)  Wt 145 lb (65.772 kg)  BMI 21.40 kg/m2  SpO2 100% Physical Exam  Constitutional: He appears well-developed and well-nourished.  HENT:  Head: Normocephalic.  Eyes: Conjunctivae are normal.  Neck: Normal range of motion.  No midline C-spine  tenderness to palpation or step-offs appreciated. Patient has full range of motion without pain.  Grip strength, biceps, triceps 5/5 bilaterally;  can differentiate between pinprick and light touch bilaterally.   Cardiovascular: Normal rate, regular rhythm and intact distal pulses.   Pulmonary/Chest: Effort normal.  Abdominal: Soft. There is no tenderness.  Neurological: He is alert.  + TTP of paraspinal musculature on the thoracic back. No point tenderness to percussion of lumbar spinal processes.  No TTP or paraspinal muscular spasm. Strength is 5 out of 5 to bilateral lower extremities at hip and knee; extensor hallucis longus 5 out of 5. Ankle strength 5 out of 5, no clonus, neurovascularly intact. No saddle anaesthesia. Patellar reflexes are 2+ bilaterally.     Psychiatric: He has a normal mood and affect.  Nursing note and vitals reviewed.   ED Course  Procedures (including  critical care time) DIAGNOSTIC STUDIES: Oxygen Saturation is 100% on RA, normal by my interpretation.   COORDINATION OF CARE: 9:55 AM- Will prescribe muscle relaxer and Motrin and give referral to orthopedist. Pt verbalizes understanding and agrees to plan.  Medications - No data to display   MDM   Final diagnoses:  Cervical strain, acute, subsequent encounter    Filed Vitals:   09/29/15 0949  BP: 114/55  Pulse: 74  Temp: 98.6 F (37 C)   TempSrc: Oral  Resp: 16  Height: 5\' 9"  (1.753 m)  Weight: 65.772 kg  SpO2: 100%    Medications  ibuprofen (ADVIL,MOTRIN) tablet 800 mg (800 mg Oral Given 09/29/15 1022)    Dennis Moreno is 31 y.o. male presenting with persistent thoracic and cervical discomfort after patient was pushing a heavy object approximately 1 months ago. There was no direct trauma. No indication for imaging. Physical exam with no significant abnormality. Patient is given a prescription for Robaxin and advised to take high-dose ibuprofen. Orthopedic referral given.  Evaluation does not show pathology that would require ongoing emergent intervention or inpatient treatment. Pt is hemodynamically stable and mentating appropriately. Discussed findings and plan with patient/guardian, who agrees with care plan. All questions answered. Return precautions discussed and outpatient follow up given.   Discharge Medication List as of 09/29/2015 10:06 AM    START taking these medications   Details  methocarbamol (ROBAXIN) 500 MG tablet Take 1 tablet (500 mg total) by mouth 2 (two) times daily as needed for muscle spasms., Starting 09/29/2015, Until Discontinued, Print            Wynetta Emery, PA-C 09/29/15 1636  Marily Memos, MD 10/01/15 0700

## 2015-10-06 ENCOUNTER — Encounter (HOSPITAL_COMMUNITY): Payer: Self-pay

## 2015-10-06 ENCOUNTER — Emergency Department (HOSPITAL_COMMUNITY)
Admission: EM | Admit: 2015-10-06 | Discharge: 2015-10-06 | Disposition: A | Discharge status: Home / Self Care | Payer: Federal, State, Local not specified - PPO | Attending: Emergency Medicine | Admitting: Emergency Medicine

## 2015-10-06 DIAGNOSIS — Z87828 Personal history of other (healed) physical injury and trauma: Secondary | ICD-10-CM | POA: Insufficient documentation

## 2015-10-06 DIAGNOSIS — M545 Low back pain: Secondary | ICD-10-CM

## 2015-10-06 DIAGNOSIS — F17200 Nicotine dependence, unspecified, uncomplicated: Secondary | ICD-10-CM | POA: Insufficient documentation

## 2015-10-06 DIAGNOSIS — M5440 Lumbago with sciatica, unspecified side: Secondary | ICD-10-CM | POA: Insufficient documentation

## 2015-10-06 DIAGNOSIS — M542 Cervicalgia: Secondary | ICD-10-CM | POA: Insufficient documentation

## 2015-10-06 NOTE — ED Provider Notes (Signed)
CSN: [id number]     Arrival date & time 10/06/15  1157 History  By signing my name below, I, [name], attest that this documentation has been prepared under the direction and in the presence of Roxy Horseman, PA-C Electronically Signed: Charline Bills, ED Scribe 10/06/2015 at 1:34 PM.   Chief Complaint  Patient presents with  . neck and back pain    The history is provided by the patient. No language interpreter was used.   HPI Comments: FORTUNE BRANNIGAN is a 30 y.o. male who presents to the Emergency Department with a chief complaint of constant, unchanged neck pain for the past month. Pt initially injured his neck while at work on 09/06/15 when pushing a large wire box full of magazines. He reports a sudden, sharp sensation in his left neck and mid back at that time. He states that he is still having pain even with light duty. Pt was referred to the back specialist but states that he could not make an appointment since he did not have a case number at that time. No other complaints reported at this time.    History reviewed. No pertinent past medical history. History reviewed. No pertinent past surgical history. No family history on file. Social History  Substance Use Topics  . Smoking status: Current Every Day Smoker  . Smokeless tobacco: None  . Alcohol Use: Yes    Review of Systems  Musculoskeletal: Positive for back pain and neck pain.   Allergies  Review of patient's allergies indicates no known allergies.  Home Medications   Prior to Admission medications   Medication Sig Start Date End Date Taking? Authorizing Provider  cyclobenzaprine (FLEXERIL) 10 MG tablet Take 1 tablet (10 mg total) by mouth 2 (two) times daily as needed for muscle spasms. 09/18/15   Roxy Horseman, PA-C  ibuprofen (ADVIL,MOTRIN) 600 MG tablet Take 1 tablet (600 mg total) by mouth every 6 (six) hours as needed. 09/18/15   Roxy Horseman, PA-C  methocarbamol (ROBAXIN) 500 MG tablet Take 1 tablet  (500 mg total) by mouth 2 (two) times daily as needed for muscle spasms. 09/29/15   Nicole Pisciotta, PA-C   BP 114/92 mmHg  Pulse 90  Temp(Src) 98.7 F (37.1 C) (Oral)  Resp 18  SpO2 100% Physical Exam  Constitutional: He is oriented to person, place, and time. He appears well-developed and well-nourished. No distress.  HENT:  Head: Normocephalic and atraumatic.  Eyes: Conjunctivae and EOM are normal. Right eye exhibits no discharge. Left eye exhibits no discharge. No scleral icterus.  Neck: Normal range of motion. Neck supple. No tracheal deviation present.  Cardiovascular: Normal rate.   Pulmonary/Chest: Effort normal. No respiratory distress.  Abdominal: Soft. He exhibits no distension.  Musculoskeletal: Normal range of motion.  No gross abnormality or deformity of spine, patient is able to ambulate, moves all extremities  Bilateral great toe extension intact Bilateral plantar/dorsiflexion intact  Neurological: He is alert and oriented to person, place, and time.  Sensation and strength intact bilaterally   Skin: Skin is warm and dry. He is not diaphoretic.  Psychiatric: He has a normal mood and affect. His behavior is normal. Judgment and thought content normal.  Nursing note and vitals reviewed.  ED Course  Procedures (including critical care time) DIAGNOSTIC STUDIES: Oxygen Saturation is 100% on RA, normal by my interpretation.    COORDINATION OF CARE: 1:32 PM-Discussed treatment plan which includes f/u with back specialist with pt at bedside and pt agreed to plan.  MDM   Final diagnoses:  Low back pain, unspecified back pain laterality, with sciatica presence unspecified    Patient with low back pain following an injury back in December when he was pulling a heavy metal cage. He states that he has not seen the back doctor. States that he is having trouble with his paperwork. He denies any new injuries or symptoms. I have stressed to the patient that he needs to  see the back specialist for further workup and evaluation as there is no evidence of emergent process today.  I personally performed the services described in this documentation, which was scribed in my presence. The recorded information has been reviewed and is accurate.      Roxy Horseman, PA-C 10/06/15 1338  Blane Ohara, MD 10/06/15 [phone]

## 2015-10-06 NOTE — Discharge Instructions (Signed)

## 2015-10-06 NOTE — ED Notes (Signed)
Patient here for further evaluation for ongoing lumbar back pain and neck pain, seen x 2 for same, denies any new trauma. Reports that he originally injured at work from lifting

## 2015-10-06 NOTE — ED Notes (Signed)
Declined W/C at D/C and was escorted to lobby by RN. 

## 2015-10-27 ENCOUNTER — Emergency Department (HOSPITAL_COMMUNITY)
Admission: EM | Admit: 2015-10-27 | Discharge: 2015-10-27 | Disposition: A | Discharge status: Home / Self Care | Payer: Federal, State, Local not specified - PPO | Attending: Emergency Medicine | Admitting: Emergency Medicine

## 2015-10-27 ENCOUNTER — Encounter (HOSPITAL_COMMUNITY): Payer: Self-pay | Admitting: Emergency Medicine

## 2015-10-27 DIAGNOSIS — F17200 Nicotine dependence, unspecified, uncomplicated: Secondary | ICD-10-CM | POA: Diagnosis not present

## 2015-10-27 DIAGNOSIS — M546 Pain in thoracic spine: Secondary | ICD-10-CM | POA: Insufficient documentation

## 2015-10-27 MED ORDER — NAPROXEN 500 MG PO TABS: 500.0000 mg | ORAL_TABLET | Freq: Two times a day (BID) | ORAL | Status: DC

## 2015-10-27 MED ORDER — CYCLOBENZAPRINE HCL 10 MG PO TABS: 10.0000 mg | ORAL_TABLET | Freq: Two times a day (BID) | ORAL | Status: DC | PRN

## 2015-10-27 NOTE — ED Notes (Signed)
Pt states for the last 2 months he has had back pain and spasms in his back after an injury at work. In December injured his back pulling and pushing a heavy cart. Pt denies any numbness or tingling in legs.

## 2015-10-27 NOTE — Discharge Instructions (Signed)

## 2015-10-27 NOTE — ED Provider Notes (Signed)
CSN: [id number]     Arrival date & time 10/27/15  1535 History  By signing my name below, I, Dennis Moreno, attest that this documentation has been prepared under the direction and in the presence of [name], NP. Electronically Signed: Elon Moreno ED Scribe. 10/27/2015. 5:23 PM.    Chief Complaint  Patient presents with  . Back Pain   The history is provided by the patient. No language interpreter was used.   HPI Comments: Dennis Moreno is a 30 y.o. male who presents to the Emergency Department complaining of waxing and waning, moderate mid-low back pain that intermittently shoots "to the back of my ears" onset two months ago.  He reports the pain is worsened with long periods of sitting or standing as well as strenuous activity.  He reports sleeping in his car on a heated seat for tx as well as drinking tea.  Per chart review, the patient was pushing a box full of wires at work when the pain onset.  He has been seen in the ED multiple times for this complaint where he was prescribed robaxin, flexeril, and ibuprofen and eferred to [employer].  However, he has not taken any medications because "I don't take pharmaceuticals" and has not f/u with specialist because of workman's compensation claim issues.  He reports  "I keep coming here so I can get proof at work so I don't get disciplined at work."  He denies bowel/bladder incontinence, lower extremity weakness/numbness.  `  History reviewed. No pertinent past medical history. History reviewed. No pertinent past surgical history. No family history on file. Social History  Substance Use Topics  . Smoking status: Current Every Day Smoker  . Smokeless tobacco: None  . Alcohol Use: Yes    Review of Systems  Musculoskeletal: Positive for back pain.  All other systems reviewed and are negative.  A complete 10 system review of systems was obtained and all systems are negative except as noted in the HPI and PMH.   Allergies   Review of patient's allergies indicates no known allergies.  Home Medications   Prior to Admission medications   Medication Sig Start Date End Date Taking? Authorizing Provider  cyclobenzaprine (FLEXERIL) 10 MG tablet Take 1 tablet (10 mg total) by mouth 2 (two) times daily as needed for muscle spasms. 09/18/15   Roxy Horseman, PA-C  ibuprofen (ADVIL,MOTRIN) 600 MG tablet Take 1 tablet (600 mg total) by mouth every 6 (six) hours as needed. 09/18/15   Roxy Horseman, PA-C  methocarbamol (ROBAXIN) 500 MG tablet Take 1 tablet (500 mg total) by mouth 2 (two) times daily as needed for muscle spasms. 09/29/15   Nicole Pisciotta, PA-C   BP 131/76 mmHg  Pulse 99  Temp(Src) 98 F (36.7 C) (Oral)  Resp 16  SpO2 100% Physical Exam  Constitutional: He is oriented to person, place, and time. He appears well-developed and well-nourished. No distress.  HENT:  Head: Normocephalic and atraumatic.  Eyes: Conjunctivae and EOM are normal.  Neck: Neck supple. No tracheal deviation present.  Cardiovascular: Normal rate.   Pulmonary/Chest: Effort normal. No respiratory distress.  Musculoskeletal: Normal range of motion.  TTP of low thoracic spine.  No step-offs or deformity  No strength deficits.   Neurological: He is alert and oriented to person, place, and time.  Skin: Skin is warm and dry.  Psychiatric: He has a normal mood and affect. His behavior is normal.  Nursing note and vitals reviewed.   ED Course  Procedures (including critical care time)  DIAGNOSTIC STUDIES: Oxygen Saturation is 100% on RA, normal by my interpretation.    COORDINATION OF CARE:  6:05 PM Will prescribe muscle relaxant and anti-inflammatory.  Patient acknowledges and agrees with plan.    Labs Review Labs Reviewed - No data to display  Imaging Review No results found. I have personally reviewed and evaluated these images and lab results as part of my medical decision-making.   EKG Interpretation None      MDM    Final diagnoses:  None  Patient with ongoing back pain since December after reported injury at work.  Has received multiple prescriptions, but is not taken anything for the pain, and has not followed up with referral physician due to workman's comp issues.  Discussed with patient that his pain will likely persist as long as he opts not to follow treatment advice. No red flag symptoms.    I personally performed the services described in this documentation, which was scribed in my presence. The recorded information has been reviewed and is accurate.   [name], NP 10/28/15 0300  Glynn Octave, MD 10/28/15 [phone]

## 2016-10-26 ENCOUNTER — Encounter (HOSPITAL_COMMUNITY): Payer: Self-pay | Admitting: Emergency Medicine

## 2016-10-26 ENCOUNTER — Emergency Department (HOSPITAL_COMMUNITY)
Admission: EM | Admit: 2016-10-26 | Discharge: 2016-10-26 | Disposition: A | Discharge status: Home / Self Care | Payer: Self-pay | Attending: Emergency Medicine | Admitting: Emergency Medicine

## 2016-10-26 DIAGNOSIS — M549 Dorsalgia, unspecified: Secondary | ICD-10-CM | POA: Insufficient documentation

## 2016-10-26 DIAGNOSIS — Z5321 Procedure and treatment not carried out due to patient leaving prior to being seen by health care provider: Secondary | ICD-10-CM | POA: Insufficient documentation

## 2016-10-26 DIAGNOSIS — F17210 Nicotine dependence, cigarettes, uncomplicated: Secondary | ICD-10-CM | POA: Insufficient documentation

## 2016-10-26 NOTE — ED Provider Notes (Signed)
Pt here with back pain and skin problem stating he came here for a referral but has to leave to get to work. He states he injured himself at work about one year ago and was requesting a referral to a Land[facility]. He also complains of a "swollen gland" and wanted a dermatology referral. He thought he had to have a referral from the ED for this but states he will call them instead of having to pay an ED bill. Patient states he is also late for work so can not stay for evaluation. Explained to the patient that we are ready to see him but he declined.  Vitals:   10/26/16 1456  BP: 126/80  Pulse: 69  Resp: 18  Temp: 97.4 F (36.3 C)   Patient left prior to evaluation.   [address] Raffo [place] Tonio Seider, NP 10/27/16 [id number]    Alvira MondayErin Schlossman, MD 10/27/16 1051

## 2016-10-26 NOTE — ED Notes (Signed)
Pt called to take back to room. Pt did not answer x3.

## 2016-10-26 NOTE — ED Notes (Signed)
Pt states he has to go to work. Leaves AMA. Pt urged to return as needed.

## 2016-10-26 NOTE — ED Triage Notes (Signed)
Pt sts lower back pain x 1 year since injuring self

## 2017-05-23 ENCOUNTER — Ambulatory Visit: Payer: Self-pay | Admitting: Physician Assistant

## 2017-05-23 NOTE — Progress Notes (Deleted)
Juanetta SnowFarranda B Bonn is a 31 y.o. male here to Establish Care, discuss depression and alcohol additction.  I acted as a Neurosurgeonscribe for [employer]Samantha Worley, PA-C [employer]na Roxene Alviar, LPN  History of Present Illness:   No chief complaint on file.   Acute Concerns:   Chronic Issues:   Health Maintenance: Immunizations -- *** Colonoscopy -- *** Mammogram -- *** PAP -- *** Bone Density -- *** PSA -- *** Diet -- *** Caffeine intake -- *** Sleep habits -- *** Exercise -- *** Weight --    Mood -- ***  No flowsheet data found.  No flowsheet data found.   Other providers/specialists: ***  No past medical history on file.   Social History   Social History  . Marital status: Single    Spouse name: N/A  . Number of children: N/A  . Years of education: N/A   Occupational History  . Not on file.   Social History Main Topics  . Smoking status: Current Every Day Smoker  . Smokeless tobacco: Not on file  . Alcohol use Yes  . Drug use: Yes  . Sexual activity: Not on file   Other Topics Concern  . Not on file   Social History Narrative  . No narrative on file    No past surgical history on file.  No family history on file.  No Known Allergies   Current Medications:   Current Outpatient Prescriptions:  .  cyclobenzaprine (FLEXERIL) 10 MG tablet, Take 1 tablet (10 mg total) by mouth 2 (two) times daily as needed for muscle spasms., Disp: 20 tablet, Rfl: 0 .  naproxen (NAPROSYN) 500 MG tablet, Take 1 tablet (500 mg total) by mouth 2 (two) times daily., Disp: 20 tablet, Rfl: 0   Review of Systems:   ROS  Vitals:   There were no vitals filed for this visit.   There is no height or weight on file to calculate BMI.  Physical Exam:   Physical Exam  Assessment and Plan:    There are no diagnoses linked to this encounter.  . Reviewed expectations re: course of current medical issues. . Discussed self-management of symptoms. . Outlined signs and symptoms indicating  need for more acute intervention. . Patient verbalized understanding and all questions were answered. . See orders for this visit as documented in the electronic medical record. . Patient received an After-Visit Summary.   CMA or LPN served as scribe during this visit. History, Physical, and Plan performed by medical provider. Documentation and orders reviewed and attested to.   Jarold MottoSamantha Worley, PA-C

## 2017-05-24 ENCOUNTER — Encounter: Payer: Self-pay | Admitting: Physician Assistant

## 2017-05-24 ENCOUNTER — Other Ambulatory Visit (HOSPITAL_COMMUNITY)
Admission: RE | Admit: 2017-05-24 | Discharge: 2017-05-24 | Disposition: A | Discharge status: Home / Self Care | Payer: Federal, State, Local not specified - PPO | Source: Ambulatory Visit | Attending: Physician Assistant | Admitting: Physician Assistant

## 2017-05-24 ENCOUNTER — Ambulatory Visit (INDEPENDENT_AMBULATORY_CARE_PROVIDER_SITE_OTHER): Payer: Federal, State, Local not specified - PPO | Admitting: Physician Assistant

## 2017-05-24 ENCOUNTER — Ambulatory Visit (INDEPENDENT_AMBULATORY_CARE_PROVIDER_SITE_OTHER): Payer: Federal, State, Local not specified - PPO | Admitting: Licensed Clinical Social Worker

## 2017-05-24 VITALS — BP 120/90 | HR 71 | Temp 98.4°F | Ht 69.0 in | Wt 137.2 lb

## 2017-05-24 DIAGNOSIS — F321 Major depressive disorder, single episode, moderate: Secondary | ICD-10-CM

## 2017-05-24 DIAGNOSIS — F1010 Alcohol abuse, uncomplicated: Secondary | ICD-10-CM | POA: Diagnosis not present

## 2017-05-24 DIAGNOSIS — Z113 Encounter for screening for infections with a predominantly sexual mode of transmission: Secondary | ICD-10-CM | POA: Insufficient documentation

## 2017-05-24 LAB — CBC WITH DIFFERENTIAL/PLATELET
BASOS ABS: 0 10*3/uL (ref 0.0–0.1)
Basophils Relative: 0.9 % (ref 0.0–3.0)
Eosinophils Absolute: 0.1 10*3/uL (ref 0.0–0.7)
Eosinophils Relative: 2.3 % (ref 0.0–5.0)
HEMATOCRIT: 44.4 % (ref 39.0–52.0)
HEMOGLOBIN: 14.8 g/dL (ref 13.0–17.0)
LYMPHS PCT: 42.7 % (ref 12.0–46.0)
Lymphs Abs: 1.6 10*3/uL (ref 0.7–4.0)
MCHC: 33.3 g/dL (ref 30.0–36.0)
MCV: 96.8 fl (ref 78.0–100.0)
Monocytes Absolute: 0.3 10*3/uL (ref 0.1–1.0)
Monocytes Relative: 7.7 % (ref 3.0–12.0)
Neutro Abs: 1.8 10*3/uL (ref 1.4–7.7)
Neutrophils Relative %: 46.4 % (ref 43.0–77.0)
PLATELETS: 331 10*3/uL (ref 150.0–400.0)
RBC: 4.59 Mil/uL (ref 4.22–5.81)
RDW: 13.2 % (ref 11.5–15.5)
WBC: 3.8 10*3/uL — AB (ref 4.0–10.5)

## 2017-05-24 LAB — COMPREHENSIVE METABOLIC PANEL
ALBUMIN: 4.8 g/dL (ref 3.5–5.2)
ALK PHOS: 46 U/L (ref 39–117)
ALT: 21 U/L (ref 0–53)
AST: 28 U/L (ref 0–37)
BILIRUBIN TOTAL: 0.4 mg/dL (ref 0.2–1.2)
BUN: 13 mg/dL (ref 6–23)
CALCIUM: 9.7 mg/dL (ref 8.4–10.5)
CO2: 31 meq/L (ref 19–32)
CREATININE: 0.85 mg/dL (ref 0.40–1.50)
Chloride: 100 mEq/L (ref 96–112)
GFR: 135.99 mL/min (ref >60.00)
Glucose, Bld: 91 mg/dL (ref 70–99)
Potassium: 3.9 mEq/L (ref 3.5–5.1)
Sodium: 138 mEq/L (ref 135–145)
TOTAL PROTEIN: 7.6 g/dL (ref 6.0–8.3)

## 2017-05-24 LAB — TSH: TSH: 1.55 u[IU]/mL (ref 0.35–4.50)
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
## 2017-05-25 LAB — HIV ANTIBODY (ROUTINE TESTING W REFLEX): HIV 1&2 Ab, 4th Generation: NONREACTIVE

## 2017-05-25 LAB — URINE CYTOLOGY ANCILLARY ONLY
CHLAMYDIA, DNA PROBE: NEGATIVE
NEISSERIA GONORRHEA: NEGATIVE
Trichomonas: NEGATIVE

## 2017-05-25 LAB — HSV(HERPES SIMPLEX VRS) I + II AB-IGG
HAV 1 IGG,TYPE SPECIFIC AB: <0.9 index
HSV 2 IGG,TYPE SPECIFIC AB: 7.57 index — ABNORMAL HIGH

## 2017-05-25 LAB — RPR: RPR Ser Ql: NONREACTIVE

## 2017-06-01 ENCOUNTER — Telehealth: Payer: Self-pay | Admitting: Physician Assistant

## 2017-06-01 ENCOUNTER — Ambulatory Visit: Payer: Self-pay | Admitting: Physician Assistant

## 2017-06-01 NOTE — Telephone Encounter (Signed)
Patient did not show for his appointment today.  Please call patient and check to see how he is doing.  [name] PA-C 06/01/17

## 2017-06-01 NOTE — Telephone Encounter (Signed)
Left detailed message on voicemail I was calling to see how you are and that you missed appt this morning at 9:00. Just wanted to make sure you are okay. Please call office.

## 2017-06-01 NOTE — Telephone Encounter (Signed)
Patient stated that he attempted to call yesterday however the office was closed to cancel his appointment. Patient states that he is seeing a psychologist for his depression and does not need to come in any time soon for a follow up on his depression. Patient sees Berniece Andreas at the [facility] office. Patient rescheduled his appointment into December. No further action required at this time unless provider would like to call and follow up, patient did not request a call.

## 2017-06-01 NOTE — Telephone Encounter (Signed)
Noted. Thank you.  [name] PA-C 06/01/17

## 2017-06-01 NOTE — Telephone Encounter (Signed)
FYI, see message. 

## 2017-06-07 ENCOUNTER — Ambulatory Visit: Payer: Self-pay | Admitting: Licensed Clinical Social Worker

## 2017-06-10 ENCOUNTER — Telehealth: Payer: Self-pay | Admitting: Physician Assistant

## 2017-06-10 NOTE — Telephone Encounter (Signed)
Patient called in reference to missing appointment with therapist he was referred to by Sam. Patient stated next available appointment she had was 07/04/17 and patient stated that was too far out. Patient was wanting to talk to someone about this ASAP. Please call patient and advise. OK to leave message.

## 2017-06-14 ENCOUNTER — Ambulatory Visit (INDEPENDENT_AMBULATORY_CARE_PROVIDER_SITE_OTHER): Payer: Federal, State, Local not specified - PPO | Admitting: Licensed Clinical Social Worker

## 2017-06-14 DIAGNOSIS — F324 Major depressive disorder, single episode, in partial remission: Secondary | ICD-10-CM | POA: Diagnosis not present

## 2017-06-14 NOTE — Telephone Encounter (Signed)
Spoke to pt, told him I was calling regarding message and missed appt with therapist. Pt said it's okay therapist called him an he has an appt today at 2:00 PM. Told him okay good just wanted to make sure you were taken care of and Lelon Mast said she would see you if you needed to come in. Pt verbalized understanding and said he is okay.

## 2017-07-04 ENCOUNTER — Ambulatory Visit: Payer: Self-pay | Admitting: Licensed Clinical Social Worker

## 2017-08-17 ENCOUNTER — Ambulatory Visit: Payer: Self-pay | Admitting: Licensed Clinical Social Worker

## 2017-08-25 ENCOUNTER — Ambulatory Visit: Payer: Federal, State, Local not specified - PPO | Admitting: Family Medicine

## 2017-09-01 ENCOUNTER — Ambulatory Visit: Payer: Federal, State, Local not specified - PPO | Admitting: Physician Assistant

## 2017-09-08 ENCOUNTER — Encounter (HOSPITAL_COMMUNITY): Payer: Self-pay

## 2017-09-08 ENCOUNTER — Emergency Department (HOSPITAL_COMMUNITY)
Admission: EM | Admit: 2017-09-08 | Discharge: 2017-09-08 | Disposition: A | Discharge status: Home / Self Care | Payer: Federal, State, Local not specified - PPO | Attending: Emergency Medicine | Admitting: Emergency Medicine

## 2017-09-08 ENCOUNTER — Other Ambulatory Visit: Payer: Self-pay

## 2017-09-08 DIAGNOSIS — K0889 Other specified disorders of teeth and supporting structures: Secondary | ICD-10-CM | POA: Insufficient documentation

## 2017-09-08 DIAGNOSIS — F17210 Nicotine dependence, cigarettes, uncomplicated: Secondary | ICD-10-CM | POA: Insufficient documentation

## 2017-09-08 MED ORDER — PENICILLIN V POTASSIUM 250 MG PO TABS
500.0000 mg | ORAL_TABLET | Freq: Once | ORAL | Status: AC
Start: 2017-09-08 — End: 2017-09-08
  Administered 2017-09-08: 500 mg via ORAL
  Filled 2017-09-08: qty 2

## 2017-09-08 MED ORDER — PENICILLIN V POTASSIUM 500 MG PO TABS: 500.0000 mg | ORAL_TABLET | Freq: Three times a day (TID) | ORAL | 0 refills | Status: DC

## 2017-09-08 MED ORDER — IBUPROFEN 800 MG PO TABS
800.0000 mg | ORAL_TABLET | Freq: Once | ORAL | Status: AC
  Administered 2017-09-08: 800 mg via ORAL
  Filled 2017-09-08: qty 1

## 2017-09-08 NOTE — ED Notes (Signed)
ED Provider at bedside. 

## 2017-09-08 NOTE — ED Provider Notes (Signed)
[facility] EMERGENCY DEPARTMENT Provider Note   CSN: [id number] Arrival date & time: 09/08/17  0003     History   Chief Complaint Chief Complaint  Patient presents with  . Dental Pain    HPI Dennis Moreno is a 31 y.o. male.  The history is provided by the patient.  Dental Pain   This is a new problem. The current episode started 12 to 24 hours ago. The problem occurs constantly. The problem has been gradually worsening. The pain is severe. Treatments tried: clove bud oil, hydrocodone. The treatment provided mild relief.  Patient reports increasing pain in his right lower jaw over the past 12-24 hours. He denies trauma, he denies fever, he denies vomiting He reports an area of decayed tooth on the right lower jaw however the pain has become acutely worse He reports he feels he has an exposed nerve He reports he is unable to see his oral surgeon He has used hydrocodone at home with some mild relief, and also been placing clove bud oil in the socket   Past Medical History:  Diagnosis Date  . Depression     There are no active problems to display for this patient.   Past Surgical History:  Procedure Laterality Date  . WISDOM TOOTH EXTRACTION Bilateral 2001       Home Medications    Prior to Admission medications   Medication Sig Start Date End Date Taking? Authorizing Provider  penicillin v potassium (VEETID) 500 MG tablet Take 1 tablet (500 mg total) by mouth 3 (three) times daily. 09/08/17   Zadie Rhine, MD    Family History Family History  Problem Relation Age of Onset  . Arthritis Mother   . Depression Mother   . Alcohol abuse Father   . Depression Father   . Depression Sister   . Depression Brother   . Alcohol abuse Maternal Grandfather   . Alcohol abuse Paternal Grandfather   . Depression Paternal Grandfather   . Depression Brother     Social History Social History   Tobacco Use  . Smoking status: Current Every Day  Smoker    Types: Cigarettes  . Smokeless tobacco: Never Used  . Tobacco comment: 4-5 cigarettes a day  Substance Use Topics  . Alcohol use: Yes    Comment: 8-9 drinks a week of liquir or beer  . Drug use: Yes    Types: Marijuana    Comment: 1/4 pound a week, decreased pass to weeks smoking only one a day.     Allergies   Hydrocodone   Review of Systems Review of Systems  Constitutional: Negative for fever.  Gastrointestinal: Negative for vomiting.     Physical Exam Updated Vital Signs BP (!) 123/52   Pulse 87   Temp 98.4 F (36.9 C) (Oral)   Resp 16   SpO2 100%   Physical Exam  CONSTITUTIONAL: Well developed/well nourished, pt sleeping on arrival to the room, no acute distress HEAD: Normocephalic/atraumatic EYES: EOMI ENMT: Mucous membranes moist, no facial edema/erythema.  No tenderness below mandible.  No trismus.  No malocclusion.  Voice normal.  Tooth 27 is not present with localized tenderness and mild swelling.  No focal abscess noted.  Floor of mouth soft.   NECK: supple no meningeal signs CV: S1/S2 noted, no murmurs/rubs/gallops noted LUNGS: Lungs are clear to auscultation bilaterally, no apparent distress ABDOMEN: soft NEURO: Pt is awake/alert/appropriate, moves all extremitiesx4.  SKIN: warm, color normal  ED Treatments / Results  Labs (all labs ordered are listed, but only abnormal results are displayed) Labs Reviewed - No data to display  EKG  EKG Interpretation None       Radiology No results found.  Procedures Procedures (including critical care time)  Medications Ordered in ED Medications  penicillin v potassium (VEETID) tablet 500 mg (500 mg Oral Given 09/08/17 0226)  ibuprofen (ADVIL,MOTRIN) tablet 800 mg (800 mg Oral Given 09/08/17 0246)     Initial Impression / Assessment and Plan / ED Course  I have reviewed the triage vital signs and the nursing notes. Narcotic database reviewed and considered in decision making      Patient presents with dental pain. Denies trauma to the mouth recently, reports tooth has been decayed in that area around #27 for some time, but pain acutely worsened Suspect a secondary infection There is no focal abscess to drain at this time He is in no distress on my arrival to room, is resting in the dark with eyes closed. At this point mainstay treatment will be penicillin and ibuprofen. Advise close follow-up with his dentist or oral surgeon I do not feel emergent imaging is required at this time I advised that symptoms should begin to improve the next 24-48 hours with both penicillin and ibuprofen  Final Clinical Impressions(s) / ED Diagnoses   Final diagnoses:  Pain, dental    ED Discharge Orders        Ordered    penicillin v potassium (VEETID) 500 MG tablet  3 times daily     09/08/17 0240       Zadie RhineWickline, Virna Livengood, MD 09/08/17 0302

## 2017-09-08 NOTE — ED Triage Notes (Signed)
Pt states he started having severe dental pain and believes he has an exposed nerve on the right bottom; pt is bent over in chair on arrival barely talking; pt denies any other sx; pt states pain at 10/10; pt state he took hydrocone prior to arrival around 2 pm; pt a&ox 4 on arrival;-Monique,RN

## 2017-09-08 NOTE — ED Notes (Signed)
Pt verbalizes understanding of d/c instructions. Pt received prescriptions. Pt ambulatory at d/c with all belongings.  

## 2017-09-12 ENCOUNTER — Ambulatory Visit: Payer: Federal, State, Local not specified - PPO | Admitting: Family Medicine

## 2017-09-19 ENCOUNTER — Telehealth: Payer: Self-pay | Admitting: *Deleted

## 2017-09-19 NOTE — Telephone Encounter (Signed)
-----   Message from Shelva MajesticStephen O Hunter, MD sent at 09/12/2017 11:10 PM EST ----- Genella MechHey Jeramy Dimmick- this patient was on my schedule for 09/12/17 and no showed. It said follow up depression which concerns me. Wondered if you may know her since shes a samantha patient and any way to get her to follow up.   Tana ConchStephen Hunter

## 2017-09-19 NOTE — Telephone Encounter (Signed)
Left message on voicemail to call office. Pt missed appointment on 12/31 for Depression. Needs to schedule follow up.

## 2017-10-06 ENCOUNTER — Ambulatory Visit: Payer: Federal, State, Local not specified - PPO | Admitting: Family Medicine

## 2017-10-07 ENCOUNTER — Encounter: Payer: Self-pay | Admitting: Family Medicine

## 2017-10-10 ENCOUNTER — Ambulatory Visit (INDEPENDENT_AMBULATORY_CARE_PROVIDER_SITE_OTHER): Payer: Federal, State, Local not specified - PPO | Admitting: Family Medicine

## 2017-10-10 ENCOUNTER — Encounter: Payer: Self-pay | Admitting: Family Medicine

## 2017-10-10 ENCOUNTER — Ambulatory Visit: Payer: Federal, State, Local not specified - PPO | Admitting: Family Medicine

## 2017-10-10 VITALS — BP 118/68 | HR 97 | Temp 98.8°F | Ht 69.0 in | Wt 134.0 lb

## 2017-10-10 DIAGNOSIS — F419 Anxiety disorder, unspecified: Secondary | ICD-10-CM | POA: Diagnosis not present

## 2017-10-10 DIAGNOSIS — F321 Major depressive disorder, single episode, moderate: Secondary | ICD-10-CM | POA: Diagnosis not present

## 2017-10-10 NOTE — Progress Notes (Signed)
Subjective:  Dennis Moreno is a 32 y.o. male who presents today with a chief complaint of depression.   HPI:  Depression/Anxiety, established problems, Stable Patient was previously seen by his PCP for both these problems approximately 3 months ago.  Overall, states that symptoms have significantly improved since his last visit however is still having quite a few problems dealing with both depression and anxiety.  Reports that he has been seeing a behavioral specialist which is helped him some.  Notes several life stressors including stressful relationship with the mother of his son and being charged with domestic violence/assault on a male.  He is also concerned that his son is not getting the care he needs while in the custody of his mother.  Notes that the charges for assault on a male were recently dropped.  Denies any SI or HI.  At this point, he does not want to start any medications.  He has a significant other who has been helping him.  He also has some family in the area.  Currently living in an extended stay hotel.   Depression screen PHQ 2/9 10/10/2017  Decreased Interest 2  Down, Depressed, Hopeless 2  PHQ - 2 Score 4  Altered sleeping 1  Tired, decreased energy 1  Change in appetite 1  Feeling bad or failure about yourself  3  Trouble concentrating 3  Moving slowly or fidgety/restless 3  Suicidal thoughts 1  PHQ-9 Score 17  Difficult doing work/chores Extremely dIfficult    GAD 7 : Generalized Anxiety Score 10/10/2017  Nervous, Anxious, on Edge 3  Control/stop worrying 3  Worry too much - different things 3  Trouble relaxing 3  Restless 2  Easily annoyed or irritable 3  Afraid - awful might happen 3  Total GAD 7 Score 20  Anxiety Difficulty Very difficult    ROS: No SI or HI.  Objective:  Physical Exam: BP 118/68 (BP Location: Left Arm, Patient Position: Sitting, Cuff Size: Normal)   Pulse 97   Temp 98.8 F (37.1 C) (Oral)   Ht 5\' 9"  (1.753 m)    Wt 134 lb (60.8 kg)   SpO2 98%   BMI 19.79 kg/m   Gen: NAD, resting comfortably Neuro: Grossly normal, moves all extremities Psych: Normal affect and thought content  Assessment/Plan:  Depression, major, single episode, moderate (HCC) Patient is stable today does not have any SI or HI.  We discussed treatment options for patient including both psychotherapy and pharmacotherapy.  Patient is reluctant to start pharmacotherapy today.  Discussed treatment options including daily medications as well as as needed medications.  He may be more interested in starting an as needed medication to help him with his anxiety and panic attacks.  I told him that I would like to start a medication called hydroxyzine.  He said that he would look into this and let us know if he would like to start.  Advised him to continue seeing behavioral health.  We also gave him a list of resources in the area to help him with his depression/anxiety and life stressors.  Anxiety See depression A/P.  Patient deferred starting medication today.  He will continue seeing therapist.  Advised him that I would like to at least start as needed medications such as hydroxyzine-stated that he would look into this and let us know if he would like to start.  Time Spent: I spent 15 minutes face-to-face with the patient, with more than half spent on counseling  for treatment options for his anxiety and depression as well as resources in the area.   Katina Degree. Jimmey Ralph, MD 10/10/2017 5:06 PM

## 2017-10-10 NOTE — Assessment & Plan Note (Signed)
Patient is stable today does not have any SI or HI.  We discussed treatment options for patient including both psychotherapy and pharmacotherapy.  Patient is reluctant to start pharmacotherapy today.  Discussed treatment options including daily medications as well as as needed medications.  He may be more interested in starting an as needed medication to help him with his anxiety and panic attacks.  I told him that I would like to start a medication called hydroxyzine.  He said that he would look into this and let us know if he would like to start.  Advised him to continue seeing behavioral health.  We also gave him a list of resources in the area to help him with his depression/anxiety and life stressors.

## 2017-10-10 NOTE — Assessment & Plan Note (Signed)
See depression A/P.  Patient deferred starting medication today.  He will continue seeing therapist.  Advised him that I would like to at least start as needed medications such as hydroxyzine-stated that he would look into this and let us know if he would like to start.

## 2017-10-10 NOTE — Patient Instructions (Signed)
You would benefit from starting a medication called hydroxyzine to use as needed for anxiety. Let me know if you would like to start this.  Please come back as needed.  Take care, Dr Jimmey RalphParker

## 2017-10-11 ENCOUNTER — Ambulatory Visit: Payer: Federal, State, Local not specified - PPO | Admitting: Licensed Clinical Social Worker

## 2017-10-24 ENCOUNTER — Encounter (HOSPITAL_COMMUNITY): Payer: Self-pay | Admitting: Emergency Medicine

## 2017-10-24 ENCOUNTER — Ambulatory Visit (HOSPITAL_COMMUNITY)
Admission: EM | Admit: 2017-10-24 | Discharge: 2017-10-24 | Disposition: A | Discharge status: Home / Self Care | Payer: Federal, State, Local not specified - PPO | Attending: Family Medicine | Admitting: Family Medicine

## 2017-10-24 DIAGNOSIS — M545 Low back pain: Secondary | ICD-10-CM

## 2017-10-24 NOTE — Discharge Instructions (Signed)
Please follow up with your primary care provider for further evaluation and management of your back pain.

## 2017-10-24 NOTE — ED Triage Notes (Signed)
PT reports he struck his left great toe 2 weeks ago and reports continued pain.  PT reports a swollen area on scrotum that he would like examined as well.  PT reports chronic back pain that has flared up over the last 2 weeks.

## 2017-10-24 NOTE — ED Provider Notes (Signed)
[facility]    CSN: [id number] Arrival date & time: 10/24/17  1328     History   Chief Complaint Chief Complaint  Patient presents with  . Back Pain  . Foot Pain    HPI Dennis Moreno is a 32 y.o. male.   Dennis Moreno presents with complaints of back pain and foot pain. On arrival to room patient states that he just would like a note stating that he was here this afternoon as he can no longer wait to be seen and would like to return to work. He states he is not taking and medication for back pain, does not want any medication for back pain, it is an injury from 2 years ago. States he will see his chiropractor tomorrow. Decline physical exam.       Past Medical History:  Diagnosis Date  . Depression     Patient Active Problem List   Diagnosis Date Noted  . Depression, major, single episode, moderate (HCC) 10/10/2017  . Anxiety 10/10/2017    Past Surgical History:  Procedure Laterality Date  . WISDOM TOOTH EXTRACTION Bilateral 2001       Home Medications    Prior to Admission medications   Not on File    Family History Family History  Problem Relation Age of Onset  . Arthritis Mother   . Depression Mother   . Alcohol abuse Father   . Depression Father   . Depression Sister   . Depression Brother   . Alcohol abuse Maternal Grandfather   . Alcohol abuse Paternal Grandfather   . Depression Paternal Grandfather   . Depression Brother     Social History Social History   Tobacco Use  . Smoking status: Current Every Day Smoker    Types: Cigarettes  . Smokeless tobacco: Never Used  . Tobacco comment: 4-5 cigarettes a day  Substance Use Topics  . Alcohol use: Yes    Comment: 8-9 drinks a week of liquir or beer  . Drug use: Yes    Types: Marijuana    Comment: 1/4 pound a week, decreased pass to weeks smoking only one a day.     Allergies   Hydrocodone   Review of Systems Review of Systems   Physical Exam Triage Vital Signs ED  Triage Vitals  Enc Vitals Group     BP 10/24/17 1453 113/74     Pulse Rate 10/24/17 1453 76     Resp 10/24/17 1453 16     Temp 10/24/17 1453 98 F (36.7 C)     Temp Source 10/24/17 1453 Oral     SpO2 10/24/17 1453 100 %     Weight 10/24/17 1452 137 lb (62.1 kg)     Height --      Head Circumference --      Peak Flow --      Pain Score 10/24/17 1452 4     Pain Loc --      Pain Edu? --      Excl. in GC? --    No data found.  Updated Vital Signs BP 113/74   Pulse 76   Temp 98 F (36.7 C) (Oral)   Resp 16   Wt 137 lb (62.1 kg)   SpO2 100%   BMI 20.23 kg/m   Visual Acuity Right Eye Distance:   Left Eye Distance:   Bilateral Distance:    Right Eye Near:   Left Eye Near:    Bilateral Near:  Physical Exam   UC Treatments / Results  Labs (all labs ordered are listed, but only abnormal results are displayed) Labs Reviewed - No data to display  EKG  EKG Interpretation None       Radiology No results found.  Procedures Procedures (including critical care time)  Medications Ordered in UC Medications - No data to display   Initial Impression / Assessment and Plan / UC Course  I have reviewed the triage vital signs and the nursing notes.  Pertinent labs & imaging results that were available during my care of the patient were reviewed by me and considered in my medical decision making (see chart for details).      Final Clinical Impressions(s) / UC Diagnoses   Final diagnoses:  Low back pain, unspecified back pain laterality, unspecified chronicity, with sciatica presence unspecified    ED Discharge Orders    None       Controlled Substance Prescriptions [place] Controlled Substance Registry consulted? Not Applicable   Georgetta HaberBurky, Natalie B, NP 10/24/17 1539

## 2017-11-23 ENCOUNTER — Ambulatory Visit: Payer: Federal, State, Local not specified - PPO | Admitting: Psychology

## 2017-12-07 ENCOUNTER — Ambulatory Visit (INDEPENDENT_AMBULATORY_CARE_PROVIDER_SITE_OTHER): Payer: Federal, State, Local not specified - PPO | Admitting: Psychology

## 2017-12-07 DIAGNOSIS — F321 Major depressive disorder, single episode, moderate: Secondary | ICD-10-CM

## 2017-12-21 ENCOUNTER — Ambulatory Visit: Payer: Self-pay | Admitting: Psychology

## 2018-02-20 ENCOUNTER — Ambulatory Visit (INDEPENDENT_AMBULATORY_CARE_PROVIDER_SITE_OTHER): Payer: Federal, State, Local not specified - PPO | Admitting: Psychology

## 2018-02-20 DIAGNOSIS — F321 Major depressive disorder, single episode, moderate: Secondary | ICD-10-CM

## 2018-02-22 ENCOUNTER — Other Ambulatory Visit: Payer: Self-pay

## 2018-02-22 ENCOUNTER — Ambulatory Visit (HOSPITAL_COMMUNITY)
Admission: EM | Admit: 2018-02-22 | Discharge: 2018-02-22 | Disposition: A | Discharge status: Home / Self Care | Payer: Federal, State, Local not specified - PPO | Attending: Family Medicine | Admitting: Family Medicine

## 2018-02-22 ENCOUNTER — Encounter (HOSPITAL_COMMUNITY): Payer: Self-pay | Admitting: Emergency Medicine

## 2018-02-22 DIAGNOSIS — F329 Major depressive disorder, single episode, unspecified: Secondary | ICD-10-CM | POA: Insufficient documentation

## 2018-02-22 DIAGNOSIS — F17210 Nicotine dependence, cigarettes, uncomplicated: Secondary | ICD-10-CM | POA: Insufficient documentation

## 2018-02-22 DIAGNOSIS — J029 Acute pharyngitis, unspecified: Secondary | ICD-10-CM | POA: Insufficient documentation

## 2018-02-22 DIAGNOSIS — F419 Anxiety disorder, unspecified: Secondary | ICD-10-CM | POA: Diagnosis not present

## 2018-02-22 LAB — POCT RAPID STREP A: Streptococcus, Group A Screen (Direct): NEGATIVE

## 2018-02-22 MED ORDER — CETIRIZINE HCL 10 MG PO TABS: 10.0000 mg | ORAL_TABLET | Freq: Every day | ORAL | 0 refills | Status: DC

## 2018-02-22 MED ORDER — FLUTICASONE PROPIONATE 50 MCG/ACT NA SUSP: 2.0000 | Freq: Every day | NASAL | 0 refills | Status: DC

## 2018-02-22 MED ORDER — LIDOCAINE VISCOUS HCL 2 % MT SOLN: 15.0000 mL | OROMUCOSAL | 0 refills | Status: DC | PRN

## 2018-02-22 NOTE — Discharge Instructions (Signed)
Rapid strep negative. Symptoms are most likely due to viral illness/ drainage down your throat. Start lidocaine for sore throat, do not eat or drink 40 mins after use as it can stunt your gag reflex. Flonase, Zyrtec for nasal congestion/drainage. You can use over the counter nasal saline rinse such as neti pot for nasal congestion. Oral cytology sent, you will be contacted with any positive results that requires further treatment. Refrain from sexual activity and alcohol use for the next 7 days. Monitor for any worsening of symptoms, swelling of the throat, trouble breathing, trouble swallowing, leaning forward to breath, drooling, go to the emergency department for further evaluation needed.

## 2018-02-22 NOTE — ED Triage Notes (Signed)
Pt woke up Saturday morning with a sore throat.

## 2018-02-22 NOTE — ED Provider Notes (Signed)
[facility]    CSN: [id number] Arrival date & time: 02/22/18  1623     History   Chief Complaint Chief Complaint  Patient presents with  . Sore Throat    HPI Dennis Moreno is a 32 y.o. male.   32 year old male comes in for 3-day history of sore throat.  States  he woke up 3 days ago with severe sore throat, had trouble swallowing, with some drooling.  He has been gargling salt water, drinking honey, and now sore throat, although present, has improved.  He denies current trouble swallowing, trouble breathing, drooling, tripoding.  Still painful to swallow.  Has a rhinorrhea, nasal congestion.  Intermittent cough.  Denies fever, chills, night sweats.  States has sexual activity prior to symptoms starting, and would like to be checked for STDs in the throat.  He had been drinking prior to symptoms starting as well, and wonders if he could be alcohol intolerant.  Current some day smoker.  Denies history of acid reflux.     Past Medical History:  Diagnosis Date  . Depression     Patient Active Problem List   Diagnosis Date Noted  . Depression, major, single episode, moderate (HCC) 10/10/2017  . Anxiety 10/10/2017    Past Surgical History:  Procedure Laterality Date  . WISDOM TOOTH EXTRACTION Bilateral 2001       Home Medications    Prior to Admission medications   Medication Sig Start Date End Date Taking? Authorizing Provider  cetirizine (ZYRTEC) 10 MG tablet Take 1 tablet (10 mg total) by mouth daily. 02/22/18   Cathie HoopsYu, Scarlett Portlock V, PA-C  fluticasone (FLONASE) 50 MCG/ACT nasal spray Place 2 sprays into both nostrils daily. 02/22/18   Cathie HoopsYu, Jameia Makris V, PA-C  lidocaine (XYLOCAINE) 2 % solution Use as directed 15 mLs in the mouth or throat as needed for mouth pain. 02/22/18   Belinda FisherYu, Riggins Cisek V, PA-C    Family History Family History  Problem Relation Age of Onset  . Arthritis Mother   . Depression Mother   . Alcohol abuse Father   . Depression Father   . Depression Sister   .  Depression Brother   . Alcohol abuse Maternal Grandfather   . Alcohol abuse Paternal Grandfather   . Depression Paternal Grandfather   . Depression Brother     Social History Social History   Tobacco Use  . Smoking status: Current Every Day Smoker    Types: Cigarettes  . Smokeless tobacco: Never Used  . Tobacco comment: 4-5 cigarettes a day  Substance Use Topics  . Alcohol use: Yes    Comment: 8-9 drinks a week of liquir or beer  . Drug use: Yes    Types: Marijuana    Comment: 1/4 pound a week, decreased pass to weeks smoking only one a day.     Allergies   Hydrocodone   Review of Systems Review of Systems  Reason unable to perform ROS: See HPI as above.     Physical Exam Triage Vital Signs ED Triage Vitals  Enc Vitals Group     BP 02/22/18 1705 118/62     Pulse Rate 02/22/18 1705 72     Resp --      Temp 02/22/18 1705 98.5 F (36.9 C)     Temp Source 02/22/18 1705 Oral     SpO2 02/22/18 1705 100 %     Weight --      Height --      Head Circumference --  Peak Flow --      Pain Score 02/22/18 1704 4     Pain Loc --      Pain Edu? --      Excl. in GC? --    No data found.  Updated Vital Signs BP 118/62 (BP Location: Left Arm)   Pulse 72   Temp 98.5 F (36.9 C) (Oral)   SpO2 100%   Physical Exam  Constitutional: He is oriented to person, place, and time. He appears well-developed and well-nourished. No distress.  HENT:  Head: Normocephalic and atraumatic.  Right Ear: Tympanic membrane, external ear and ear canal normal. Tympanic membrane is not erythematous and not bulging.  Left Ear: Tympanic membrane, external ear and ear canal normal. Tympanic membrane is not erythematous and not bulging.  Nose: Nose normal. Right sinus exhibits no maxillary sinus tenderness and no frontal sinus tenderness. Left sinus exhibits no maxillary sinus tenderness and no frontal sinus tenderness.  Mouth/Throat: Uvula is midline and mucous membranes are normal.  Posterior oropharyngeal erythema present. No tonsillar exudate.  Eyes: Pupils are equal, round, and reactive to light. Conjunctivae are normal.  Neck: Normal range of motion. Neck supple.  Cardiovascular: Normal rate, regular rhythm and normal heart sounds. Exam reveals no gallop and no friction rub.  No murmur heard. Pulmonary/Chest: Effort normal and breath sounds normal. He has no decreased breath sounds. He has no wheezes. He has no rhonchi. He has no rales.  Lymphadenopathy:    He has no cervical adenopathy.  Neurological: He is alert and oriented to person, place, and time.  Skin: Skin is warm and dry.  Psychiatric: He has a normal mood and affect. His behavior is normal. Judgment normal.     UC Treatments / Results  Labs (all labs ordered are listed, but only abnormal results are displayed) Labs Reviewed  CULTURE, GROUP A STREP [facility])  POCT RAPID STREP A  CYTOLOGY, (ORAL, ANAL, URETHRAL) ANCILLARY ONLY    EKG None  Radiology No results found.  Procedures Procedures (including critical care time)  Medications Ordered in UC Medications - No data to display  Initial Impression / Assessment and Plan / UC Course  I have reviewed the triage vital signs and the nursing notes.  Pertinent labs & imaging results that were available during my care of the patient were reviewed by me and considered in my medical decision making (see chart for details).    Rapid strep negative. Patient is nontoxic in appearance. Symptomatic treatment as needed.  Oral cytology sent, patient will be contacted with any positive results.  Return precautions given.   Final Clinical Impressions(s) / UC Diagnoses   Final diagnoses:  Pharyngitis, unspecified etiology    ED Prescriptions    Medication Sig Dispense Auth. Provider   lidocaine (XYLOCAINE) 2 % solution Use as directed 15 mLs in the mouth or throat as needed for mouth pain. 60 mL Layonna Dobie V, PA-C   fluticasone (FLONASE) 50 MCG/ACT nasal  spray Place 2 sprays into both nostrils daily. 1 g Zamarah Ullmer V, PA-C   cetirizine (ZYRTEC) 10 MG tablet Take 1 tablet (10 mg total) by mouth daily. 15 tablet Threasa Alpha, [place] 02/22/18 1743

## 2018-02-23 LAB — CYTOLOGY, (ORAL, ANAL, URETHRAL) ANCILLARY ONLY
Chlamydia: NEGATIVE
Neisseria Gonorrhea: NEGATIVE
TRICH (WINDOWPATH): POSITIVE — AB

## 2018-02-24 ENCOUNTER — Telehealth (HOSPITAL_COMMUNITY): Payer: Self-pay

## 2018-02-24 MED ORDER — METRONIDAZOLE 500 MG PO TABS: 500.0000 mg | ORAL_TABLET | Freq: Two times a day (BID) | ORAL | 0 refills | Status: DC

## 2018-02-24 NOTE — Telephone Encounter (Signed)
Trichomonas is positive. Rx metronidazole 500mg bid x 7d #14 no refills was sent to the pharmacy of record. PT called and made aware.  Educated patient to refrain from sexual intercourse for 7 days to give the medicine time to work. Sexual partners need to be notified and tested/treated. Condoms may reduce risk of reinfection.  Recheck for further evaluation if symptoms are not improving. Pt verbalized understanding.   

## 2018-02-25 ENCOUNTER — Telehealth (HOSPITAL_COMMUNITY): Payer: Self-pay

## 2018-02-25 LAB — CULTURE, GROUP A STREP (THRC)

## 2018-02-25 NOTE — Telephone Encounter (Signed)
Attempted to reach patient regarding Culture is positive for non group A Strep germ.  This is a finding of uncertain significance; not the typical 'strep throat' germ.  Unable to reach patient to assess need for antibiotic.

## 2018-04-07 ENCOUNTER — Ambulatory Visit: Payer: Self-pay | Admitting: Psychology

## 2018-11-19 ENCOUNTER — Ambulatory Visit (HOSPITAL_COMMUNITY)
Admission: EM | Admit: 2018-11-19 | Discharge: 2018-11-19 | Disposition: A | Discharge status: Home / Self Care | Payer: Federal, State, Local not specified - PPO | Attending: Family Medicine | Admitting: Family Medicine

## 2018-11-19 ENCOUNTER — Encounter (HOSPITAL_COMMUNITY): Payer: Self-pay

## 2018-11-19 DIAGNOSIS — J00 Acute nasopharyngitis [common cold]: Secondary | ICD-10-CM | POA: Diagnosis not present

## 2018-11-19 MED ORDER — IPRATROPIUM BROMIDE 0.06 % NA SOLN: 2.0000 | Freq: Four times a day (QID) | NASAL | 12 refills | Status: DC

## 2018-11-19 MED ORDER — DM-GUAIFENESIN ER 30-600 MG PO TB12: 1.0000 | ORAL_TABLET | Freq: Two times a day (BID) | ORAL | 0 refills | Status: DC

## 2018-11-19 NOTE — ED Triage Notes (Signed)
Pt presents with upper respiratory cold symptoms; nasal drainage, congestion, and cough since yesterday.

## 2018-11-19 NOTE — ED Provider Notes (Signed)
[facility]    CSN: [id number] Arrival date & time: 11/19/18  1014     History   Chief Complaint Chief Complaint  Patient presents with  . URI    HPI Dennis Moreno is a 33 y.o. male.   Dennis Moreno presents with complaints of uri symptoms which started two days ago. Runny nose, congestion, ear pressure. Woke this morning with sore throat. Very infrequent cough. Occasional sneezing. No fevers. Nausea yesterday. None today. No vomiting or diarrhea. Normal urination. No known ill contacts. No rash. Hasn't taken any medications for symptoms. Without contributing medical history.      ROS per HPI, negative if not otherwise mentioned.      Past Medical History:  Diagnosis Date  . Depression     Patient Active Problem List   Diagnosis Date Noted  . Depression, major, single episode, moderate (HCC) 10/10/2017  . Anxiety 10/10/2017    Past Surgical History:  Procedure Laterality Date  . WISDOM TOOTH EXTRACTION Bilateral 2001       Home Medications    Prior to Admission medications   Medication Sig Start Date End Date Taking? Authorizing Provider  cetirizine (ZYRTEC) 10 MG tablet Take 1 tablet (10 mg total) by mouth daily. 02/22/18   Belinda Fisher, PA-C  dextromethorphan-guaiFENesin (MUCINEX DM) 30-600 MG 12hr tablet Take 1 tablet by mouth 2 (two) times daily. 11/19/18   Linus Mako B, NP  ipratropium (ATROVENT) 0.06 % nasal spray Place 2 sprays into both nostrils 4 (four) times daily. 11/19/18   Georgetta Haber, NP  lidocaine (XYLOCAINE) 2 % solution Use as directed 15 mLs in the mouth or throat as needed for mouth pain. 02/22/18   Belinda Fisher, PA-C    Family History Family History  Problem Relation Age of Onset  . Arthritis Mother   . Depression Mother   . Alcohol abuse Father   . Depression Father   . Depression Sister   . Depression Brother   . Alcohol abuse Maternal Grandfather   . Alcohol abuse Paternal Grandfather   . Depression Paternal Grandfather    . Depression Brother     Social History Social History   Tobacco Use  . Smoking status: Current Every Day Smoker    Types: Cigarettes  . Smokeless tobacco: Never Used  . Tobacco comment: 4-5 cigarettes a day  Substance Use Topics  . Alcohol use: Yes    Comment: 8-9 drinks a week of liquir or beer  . Drug use: Yes    Types: Marijuana    Comment: 1/4 pound a week, decreased pass to weeks smoking only one a day.     Allergies   Hydrocodone   Review of Systems Review of Systems   Physical Exam Triage Vital Signs ED Triage Vitals  Enc Vitals Group     BP 11/19/18 1031 117/77     Pulse Rate 11/19/18 1031 93     Resp 11/19/18 1031 18     Temp 11/19/18 1031 98.3 F (36.8 C)     Temp Source 11/19/18 1031 Oral     SpO2 11/19/18 1031 98 %     Weight --      Height --      Head Circumference --      Peak Flow --      Pain Score 11/19/18 1034 4     Pain Loc --      Pain Edu? --      Excl. in GC? --  No data found.  Updated Vital Signs BP 117/77 (BP Location: Left Arm)   Pulse 93   Temp 98.3 F (36.8 C) (Oral)   Resp 18   SpO2 98%    Physical Exam Vitals signs reviewed.  Constitutional:      Appearance: He is well-developed.  HENT:     Head: Normocephalic and atraumatic.     Right Ear: Tympanic membrane, ear canal and external ear normal.     Left Ear: Tympanic membrane, ear canal and external ear normal.     Nose: Rhinorrhea present.     Right Sinus: No maxillary sinus tenderness or frontal sinus tenderness.     Left Sinus: No maxillary sinus tenderness or frontal sinus tenderness.     Mouth/Throat:     Pharynx: Uvula midline.  Eyes:     Conjunctiva/sclera: Conjunctivae normal.     Pupils: Pupils are equal, round, and reactive to light.  Neck:     Musculoskeletal: Normal range of motion.  Cardiovascular:     Rate and Rhythm: Normal rate and regular rhythm.  Pulmonary:     Effort: Pulmonary effort is normal.     Breath sounds: Normal breath  sounds.  Lymphadenopathy:     Cervical: No cervical adenopathy.  Skin:    General: Skin is warm and dry.  Neurological:     Mental Status: He is alert and oriented to person, place, and time.      UC Treatments / Results  Labs (all labs ordered are listed, but only abnormal results are displayed) Labs Reviewed - No data to display  EKG None  Radiology No results found.  Procedures Procedures (including critical care time)  Medications Ordered in UC Medications - No data to display  Initial Impression / Assessment and Plan / UC Course  I have reviewed the triage vital signs and the nursing notes.  Pertinent labs & imaging results that were available during my care of the patient were reviewed by me and considered in my medical decision making (see chart for details).     Non toxic, afebrile. Benign physical exam. History and physical consistent with viral illness.  Supportive cares recommended. If symptoms worsen or do not improve in the next week to return to be seen or to follow up with PCP.  Patient verbalized understanding and agreeable to plan.   Final Clinical Impressions(s) / UC Diagnoses   Final diagnoses:  Acute nasopharyngitis     Discharge Instructions     Push fluids to ensure adequate hydration and keep secretions thin.  Tylenol and/or ibuprofen as needed for pain or fevers.  Over the counter treatments as needed for symptoms. I would expect improvement in the next 5-7 days.  If symptoms worsen or do not improve in the next week to return to be seen or to follow up with your PCP.     ED Prescriptions    Medication Sig Dispense Auth. Provider   ipratropium (ATROVENT) 0.06 % nasal spray Place 2 sprays into both nostrils 4 (four) times daily. 15 mL Linus Mako B, NP   dextromethorphan-guaiFENesin (MUCINEX DM) 30-600 MG 12hr tablet Take 1 tablet by mouth 2 (two) times daily. 20 tablet Georgetta Haber, NP     Controlled Substance Prescriptions [place]  Controlled Substance Registry consulted? Not Applicable   Georgetta Haber, NP 11/19/18 1050

## 2018-11-19 NOTE — Discharge Instructions (Signed)
Push fluids to ensure adequate hydration and keep secretions thin.  Tylenol and/or ibuprofen as needed for pain or fevers.  Over the counter treatments as needed for symptoms. I would expect improvement in the next 5-7 days.  If symptoms worsen or do not improve in the next week to return to be seen or to follow up with your PCP.

## 2019-10-10 DIAGNOSIS — M9901 Segmental and somatic dysfunction of cervical region: Secondary | ICD-10-CM | POA: Diagnosis not present

## 2019-10-10 DIAGNOSIS — M9902 Segmental and somatic dysfunction of thoracic region: Secondary | ICD-10-CM | POA: Diagnosis not present

## 2019-10-10 DIAGNOSIS — M47812 Spondylosis without myelopathy or radiculopathy, cervical region: Secondary | ICD-10-CM | POA: Diagnosis not present

## 2019-10-10 DIAGNOSIS — M9903 Segmental and somatic dysfunction of lumbar region: Secondary | ICD-10-CM | POA: Diagnosis not present

## 2019-10-15 DIAGNOSIS — M5441 Lumbago with sciatica, right side: Secondary | ICD-10-CM | POA: Diagnosis not present

## 2019-10-15 DIAGNOSIS — M9902 Segmental and somatic dysfunction of thoracic region: Secondary | ICD-10-CM | POA: Diagnosis not present

## 2019-10-15 DIAGNOSIS — M47812 Spondylosis without myelopathy or radiculopathy, cervical region: Secondary | ICD-10-CM | POA: Diagnosis not present

## 2019-10-15 DIAGNOSIS — M9901 Segmental and somatic dysfunction of cervical region: Secondary | ICD-10-CM | POA: Diagnosis not present

## 2019-10-15 DIAGNOSIS — M9903 Segmental and somatic dysfunction of lumbar region: Secondary | ICD-10-CM | POA: Diagnosis not present

## 2019-10-16 DIAGNOSIS — M9901 Segmental and somatic dysfunction of cervical region: Secondary | ICD-10-CM | POA: Diagnosis not present

## 2019-10-16 DIAGNOSIS — M9903 Segmental and somatic dysfunction of lumbar region: Secondary | ICD-10-CM | POA: Diagnosis not present

## 2019-10-16 DIAGNOSIS — M5441 Lumbago with sciatica, right side: Secondary | ICD-10-CM | POA: Diagnosis not present

## 2019-10-16 DIAGNOSIS — M9902 Segmental and somatic dysfunction of thoracic region: Secondary | ICD-10-CM | POA: Diagnosis not present

## 2019-10-16 DIAGNOSIS — M47812 Spondylosis without myelopathy or radiculopathy, cervical region: Secondary | ICD-10-CM | POA: Diagnosis not present

## 2019-10-17 DIAGNOSIS — M9901 Segmental and somatic dysfunction of cervical region: Secondary | ICD-10-CM | POA: Diagnosis not present

## 2019-10-17 DIAGNOSIS — M9903 Segmental and somatic dysfunction of lumbar region: Secondary | ICD-10-CM | POA: Diagnosis not present

## 2019-10-17 DIAGNOSIS — M5441 Lumbago with sciatica, right side: Secondary | ICD-10-CM | POA: Diagnosis not present

## 2019-10-17 DIAGNOSIS — M47812 Spondylosis without myelopathy or radiculopathy, cervical region: Secondary | ICD-10-CM | POA: Diagnosis not present

## 2019-10-17 DIAGNOSIS — M9902 Segmental and somatic dysfunction of thoracic region: Secondary | ICD-10-CM | POA: Diagnosis not present

## 2019-10-23 DIAGNOSIS — M9902 Segmental and somatic dysfunction of thoracic region: Secondary | ICD-10-CM | POA: Diagnosis not present

## 2019-10-23 DIAGNOSIS — M9901 Segmental and somatic dysfunction of cervical region: Secondary | ICD-10-CM | POA: Diagnosis not present

## 2019-10-23 DIAGNOSIS — M9903 Segmental and somatic dysfunction of lumbar region: Secondary | ICD-10-CM | POA: Diagnosis not present

## 2019-10-23 DIAGNOSIS — M5441 Lumbago with sciatica, right side: Secondary | ICD-10-CM | POA: Diagnosis not present

## 2019-10-23 DIAGNOSIS — M47812 Spondylosis without myelopathy or radiculopathy, cervical region: Secondary | ICD-10-CM | POA: Diagnosis not present

## 2019-10-31 DIAGNOSIS — M9901 Segmental and somatic dysfunction of cervical region: Secondary | ICD-10-CM | POA: Diagnosis not present

## 2019-10-31 DIAGNOSIS — M47812 Spondylosis without myelopathy or radiculopathy, cervical region: Secondary | ICD-10-CM | POA: Diagnosis not present

## 2019-10-31 DIAGNOSIS — M9903 Segmental and somatic dysfunction of lumbar region: Secondary | ICD-10-CM | POA: Diagnosis not present

## 2019-10-31 DIAGNOSIS — M9902 Segmental and somatic dysfunction of thoracic region: Secondary | ICD-10-CM | POA: Diagnosis not present

## 2019-10-31 DIAGNOSIS — M5441 Lumbago with sciatica, right side: Secondary | ICD-10-CM | POA: Diagnosis not present

## 2019-11-14 DIAGNOSIS — M9901 Segmental and somatic dysfunction of cervical region: Secondary | ICD-10-CM | POA: Diagnosis not present

## 2019-11-14 DIAGNOSIS — M9902 Segmental and somatic dysfunction of thoracic region: Secondary | ICD-10-CM | POA: Diagnosis not present

## 2019-11-14 DIAGNOSIS — M9903 Segmental and somatic dysfunction of lumbar region: Secondary | ICD-10-CM | POA: Diagnosis not present

## 2019-11-14 DIAGNOSIS — M47812 Spondylosis without myelopathy or radiculopathy, cervical region: Secondary | ICD-10-CM | POA: Diagnosis not present

## 2019-11-14 DIAGNOSIS — M5441 Lumbago with sciatica, right side: Secondary | ICD-10-CM | POA: Diagnosis not present

## 2019-11-28 DIAGNOSIS — M9901 Segmental and somatic dysfunction of cervical region: Secondary | ICD-10-CM | POA: Diagnosis not present

## 2019-11-28 DIAGNOSIS — M47812 Spondylosis without myelopathy or radiculopathy, cervical region: Secondary | ICD-10-CM | POA: Diagnosis not present

## 2019-11-28 DIAGNOSIS — M5441 Lumbago with sciatica, right side: Secondary | ICD-10-CM | POA: Diagnosis not present

## 2019-11-28 DIAGNOSIS — M9902 Segmental and somatic dysfunction of thoracic region: Secondary | ICD-10-CM | POA: Diagnosis not present

## 2019-11-28 DIAGNOSIS — M9903 Segmental and somatic dysfunction of lumbar region: Secondary | ICD-10-CM | POA: Diagnosis not present

## 2019-12-10 DIAGNOSIS — M9903 Segmental and somatic dysfunction of lumbar region: Secondary | ICD-10-CM | POA: Diagnosis not present

## 2019-12-10 DIAGNOSIS — M9902 Segmental and somatic dysfunction of thoracic region: Secondary | ICD-10-CM | POA: Diagnosis not present

## 2019-12-10 DIAGNOSIS — M47812 Spondylosis without myelopathy or radiculopathy, cervical region: Secondary | ICD-10-CM | POA: Diagnosis not present

## 2019-12-10 DIAGNOSIS — M5441 Lumbago with sciatica, right side: Secondary | ICD-10-CM | POA: Diagnosis not present

## 2019-12-10 DIAGNOSIS — M9901 Segmental and somatic dysfunction of cervical region: Secondary | ICD-10-CM | POA: Diagnosis not present

## 2019-12-12 DIAGNOSIS — M9903 Segmental and somatic dysfunction of lumbar region: Secondary | ICD-10-CM | POA: Diagnosis not present

## 2019-12-12 DIAGNOSIS — M5441 Lumbago with sciatica, right side: Secondary | ICD-10-CM | POA: Diagnosis not present

## 2019-12-12 DIAGNOSIS — M47812 Spondylosis without myelopathy or radiculopathy, cervical region: Secondary | ICD-10-CM | POA: Diagnosis not present

## 2019-12-12 DIAGNOSIS — M9902 Segmental and somatic dysfunction of thoracic region: Secondary | ICD-10-CM | POA: Diagnosis not present

## 2019-12-12 DIAGNOSIS — M9901 Segmental and somatic dysfunction of cervical region: Secondary | ICD-10-CM | POA: Diagnosis not present

## 2019-12-18 DIAGNOSIS — M9901 Segmental and somatic dysfunction of cervical region: Secondary | ICD-10-CM | POA: Diagnosis not present

## 2019-12-18 DIAGNOSIS — M47812 Spondylosis without myelopathy or radiculopathy, cervical region: Secondary | ICD-10-CM | POA: Diagnosis not present

## 2019-12-18 DIAGNOSIS — M9903 Segmental and somatic dysfunction of lumbar region: Secondary | ICD-10-CM | POA: Diagnosis not present

## 2019-12-18 DIAGNOSIS — M5441 Lumbago with sciatica, right side: Secondary | ICD-10-CM | POA: Diagnosis not present

## 2019-12-18 DIAGNOSIS — M9902 Segmental and somatic dysfunction of thoracic region: Secondary | ICD-10-CM | POA: Diagnosis not present

## 2019-12-19 DIAGNOSIS — M9901 Segmental and somatic dysfunction of cervical region: Secondary | ICD-10-CM | POA: Diagnosis not present

## 2019-12-19 DIAGNOSIS — M47812 Spondylosis without myelopathy or radiculopathy, cervical region: Secondary | ICD-10-CM | POA: Diagnosis not present

## 2019-12-19 DIAGNOSIS — M9903 Segmental and somatic dysfunction of lumbar region: Secondary | ICD-10-CM | POA: Diagnosis not present

## 2019-12-19 DIAGNOSIS — M5441 Lumbago with sciatica, right side: Secondary | ICD-10-CM | POA: Diagnosis not present

## 2019-12-19 DIAGNOSIS — M9902 Segmental and somatic dysfunction of thoracic region: Secondary | ICD-10-CM | POA: Diagnosis not present

## 2020-01-01 DIAGNOSIS — M9902 Segmental and somatic dysfunction of thoracic region: Secondary | ICD-10-CM | POA: Diagnosis not present

## 2020-01-01 DIAGNOSIS — M9903 Segmental and somatic dysfunction of lumbar region: Secondary | ICD-10-CM | POA: Diagnosis not present

## 2020-01-01 DIAGNOSIS — M5441 Lumbago with sciatica, right side: Secondary | ICD-10-CM | POA: Diagnosis not present

## 2020-01-01 DIAGNOSIS — M9901 Segmental and somatic dysfunction of cervical region: Secondary | ICD-10-CM | POA: Diagnosis not present

## 2020-01-01 DIAGNOSIS — M47812 Spondylosis without myelopathy or radiculopathy, cervical region: Secondary | ICD-10-CM | POA: Diagnosis not present

## 2020-03-13 ENCOUNTER — Telehealth: Payer: Federal, State, Local not specified - PPO | Admitting: Family Medicine

## 2020-03-13 ENCOUNTER — Other Ambulatory Visit: Payer: Self-pay

## 2020-03-13 DIAGNOSIS — Z538 Procedure and treatment not carried out for other reasons: Secondary | ICD-10-CM

## 2020-03-13 NOTE — Progress Notes (Signed)
On check in he advised he prefers to be seen in person for this. He was going to go to [facility] or ER - but hates the wait when he goes there and does not feel this is urgent. He asks if I can assist with an inperson visit as he feels that is needed rather than a virtual visit. Advise I will send a message to his PCP office and that he should call first thing in the morning to request an inperson visit. Also discussed urgent care options locally and advise if any severe symptoms to got to the ER. Cancelled appt.

## 2020-03-14 NOTE — Progress Notes (Signed)
Called pt and LVM to schedule appt

## 2020-03-18 ENCOUNTER — Ambulatory Visit: Payer: Federal, State, Local not specified - PPO | Admitting: Physician Assistant

## 2020-03-31 ENCOUNTER — Ambulatory Visit: Payer: Federal, State, Local not specified - PPO | Admitting: Physician Assistant

## 2020-03-31 NOTE — Progress Notes (Deleted)
Dennis Moreno is a 34 y.o. male here for a new problem.  I acted as a Neurosurgeon for [employer], PA-C [employer], LPN   History of Present Illness:   No chief complaint on file.   HPI  Neck pain   Past Medical History:  Diagnosis Date  . Depression      Social History   Tobacco Use  . Smoking status: Current Every Day Smoker    Types: Cigarettes  . Smokeless tobacco: Never Used  . Tobacco comment: 4-5 cigarettes a day  Vaping Use  . Vaping Use: Never used  Substance Use Topics  . Alcohol use: Yes    Comment: 8-9 drinks a week of liquir or beer  . Drug use: Yes    Types: Marijuana    Comment: 1/4 pound a week, decreased pass to weeks smoking only one a day.    Past Surgical History:  Procedure Laterality Date  . WISDOM TOOTH EXTRACTION Bilateral 2001    Family History  Problem Relation Age of Onset  . Arthritis Mother   . Depression Mother   . Alcohol abuse Father   . Depression Father   . Depression Sister   . Depression Brother   . Alcohol abuse Maternal Grandfather   . Alcohol abuse Paternal Grandfather   . Depression Paternal Grandfather   . Depression Brother     Allergies  Allergen Reactions  . Hydrocodone Itching    Current Medications:   Current Outpatient Medications:  .  cetirizine (ZYRTEC) 10 MG tablet, Take 1 tablet (10 mg total) by mouth daily., Disp: 15 tablet, Rfl: 0 .  dextromethorphan-guaiFENesin (MUCINEX DM) 30-600 MG 12hr tablet, Take 1 tablet by mouth 2 (two) times daily., Disp: 20 tablet, Rfl: 0 .  ipratropium (ATROVENT) 0.06 % nasal spray, Place 2 sprays into both nostrils 4 (four) times daily., Disp: 15 mL, Rfl: 12 .  lidocaine (XYLOCAINE) 2 % solution, Use as directed 15 mLs in the mouth or throat as needed for mouth pain., Disp: 60 mL, Rfl: 0   Review of Systems:   ROS  Vitals:   There were no vitals filed for this visit.   There is no height or weight on file to calculate BMI.  Physical Exam:   Physical  Exam  Results for orders placed or performed during the hospital encounter of 02/22/18  Culture, group A strep (throat)   Specimen: Throat  Result Value Ref Range   Specimen Description THROAT    Special Requests      NONE Performed at [facility], [address] [place], [place] [address]    Culture FEW STREPTOCOCCUS,BETA HEMOLYTIC NOT GROUP A    Report Status 02/25/2018 FINAL   POCT rapid strep A [facility])  Result Value Ref Range   Streptococcus, Group A Screen (Direct) NEGATIVE NEGATIVE  Cytology (oral, anal, urethral) ancillary only  Result Value Ref Range   Chlamydia Negative    Neisseria Gonorrhea Negative    Trichomonas **POSITIVE** (A)     Assessment and Plan:   There are no diagnoses linked to this encounter.  . Reviewed expectations re: course of current medical issues. . Discussed self-management of symptoms. . Outlined signs and symptoms indicating need for more acute intervention. . Patient verbalized understanding and all questions were answered. . See orders for this visit as documented in the electronic medical record. . Patient received an After-Visit Summary.  ***  [name], PA-C

## 2020-04-02 ENCOUNTER — Encounter: Payer: Self-pay | Admitting: Physician Assistant

## 2020-04-02 ENCOUNTER — Ambulatory Visit (INDEPENDENT_AMBULATORY_CARE_PROVIDER_SITE_OTHER): Payer: Federal, State, Local not specified - PPO | Admitting: Physician Assistant

## 2020-04-02 ENCOUNTER — Other Ambulatory Visit: Payer: Self-pay

## 2020-04-02 VITALS — BP 110/74 | HR 68 | Temp 98.2°F | Ht 69.0 in | Wt 151.4 lb

## 2020-04-02 DIAGNOSIS — M542 Cervicalgia: Secondary | ICD-10-CM | POA: Diagnosis not present

## 2020-04-02 DIAGNOSIS — M545 Low back pain, unspecified: Secondary | ICD-10-CM

## 2020-04-02 NOTE — Patient Instructions (Addendum)
It was great to see you!  A referral has been placed for you to see Dr. Clementeen Graham or Dr. Terrilee Files with [facility]. Someone from there office will be in touch soon regarding your appointment with him. His location: Psychiatrist Medicine at [facility] [address] on the 1st floor.   Phone number [phone], Fax [phone].  This location is across the street from the entrance to [employer] and in the same complex as the [facility] and [employer] bank  Take care,  [name] PA-C

## 2020-04-02 NOTE — Progress Notes (Signed)
Dennis Moreno is a 34 y.o. male here for a new problem.  I acted as a Neurosurgeon for [employer], PA-C Corky Mull, LPN   History of Present Illness:   Chief Complaint  Patient presents with  . Neck Pain  . Back Pain    HPI   Neck and back pain On 09/06/15 he had an injury at his job, pushing a large wire box full of magazines when "he felt a sudden, sharp sensation in his left neck and mid back." He went to the ER for this on 09/18/15. He was referred to [facility] and he did go to this office -- he states that they were unable to give him any specific plan for treatment.  Since that time he has had ongoing neck and back pain. He last had xrays about 4 months ago at his chiropractor office. He has done a few sessions of PT.  He is in constant pain in his back and neck. Doesn't really take ibuprofen because it doesn't really help. He states that he was told he has some compression in his lower spine and neck.   Denies: bowel/bladder incontinence, numbness/tingling of lower legs  Past Medical History:  Diagnosis Date  . Depression      Social History   Tobacco Use  . Smoking status: Current Every Day Smoker    Types: Cigarettes  . Smokeless tobacco: Never Used  . Tobacco comment: 4-5 cigarettes a day  Vaping Use  . Vaping Use: Never used  Substance Use Topics  . Alcohol use: Yes    Comment: 8-9 drinks a week of liquir or beer  . Drug use: Yes    Types: Marijuana    Comment: 1/4 pound a week, decreased pass to weeks smoking only one a day.    Past Surgical History:  Procedure Laterality Date  . WISDOM TOOTH EXTRACTION Bilateral 2001    Family History  Problem Relation Age of Onset  . Arthritis Mother   . Depression Mother   . Alcohol abuse Father   . Depression Father   . Depression Sister   . Depression Brother   . Alcohol abuse Maternal Grandfather   . Alcohol abuse Paternal Grandfather   . Depression Paternal Grandfather   . Depression  Brother     Allergies  Allergen Reactions  . Hydrocodone Itching    Current Medications:   Current Outpatient Medications:  .  Acetaminophen (MIDOL PO), Take 1-2 tablets by mouth as needed., Disp: , Rfl:  .  BEE POLLEN PO, Take 5 mLs by mouth daily., Disp: , Rfl:    Review of Systems:   ROS Negative unless otherwise specified per HPI.   Vitals:   Vitals:   04/02/20 0911  BP: 110/74  Pulse: 68  Temp: 98.2 F (36.8 C)  TempSrc: Temporal  SpO2: 97%  Weight: 151 lb 6.1 oz (68.7 kg)  Height: 5\' 9"  (1.753 m)     Body mass index is 22.35 kg/m.  Physical Exam:   Physical Exam Vitals and nursing note reviewed.  Constitutional:      General: He is not in acute distress.    Appearance: He is well-developed. He is not ill-appearing or toxic-appearing.  Cardiovascular:     Rate and Rhythm: Normal rate and regular rhythm.     Pulses: Normal pulses.     Heart sounds: Normal heart sounds, S1 normal and S2 normal.     Comments: No LE edema Pulmonary:     Effort:  Pulmonary effort is normal.     Breath sounds: Normal breath sounds.  Skin:    General: Skin is warm and dry.  Neurological:     Mental Status: He is alert.     GCS: GCS eye subscore is 4. GCS verbal subscore is 5. GCS motor subscore is 6.  Psychiatric:        Speech: Speech normal.        Behavior: Behavior normal. Behavior is cooperative.      Assessment and Plan:   [name] was seen today for neck pain and back pain.  Diagnoses and all orders for this visit:  Cervical pain; Lumbar pain No red flags on discussion. He is asking for workers comp paperwork which I told them that we do not do. He is also asking about possible FMLA paperwork, but he has not been evaluated by a specialist physician for at least two years for his back pain, so I don't feel comfortable completing this at this time. Referral to sports medicine for further evaluation. -     Ambulatory referral to Sports Medicine  . Reviewed  expectations re: course of current medical issues. . Discussed self-management of symptoms. . Outlined signs and symptoms indicating need for more acute intervention. . Patient verbalized understanding and all questions were answered. . See orders for this visit as documented in the electronic medical record. . Patient received an After-Visit Summary.  CMA or LPN served as scribe during this visit. History, Physical, and Plan performed by medical provider. The above documentation has been reviewed and is accurate and complete.  [name], PA-C

## 2020-04-17 ENCOUNTER — Other Ambulatory Visit: Payer: Self-pay

## 2020-04-17 ENCOUNTER — Ambulatory Visit: Payer: Federal, State, Local not specified - PPO | Admitting: Family Medicine

## 2020-04-17 ENCOUNTER — Ambulatory Visit (INDEPENDENT_AMBULATORY_CARE_PROVIDER_SITE_OTHER): Payer: Federal, State, Local not specified - PPO

## 2020-04-17 VITALS — BP 110/68 | HR 93 | Ht 69.0 in | Wt 159.0 lb

## 2020-04-17 DIAGNOSIS — G8929 Other chronic pain: Secondary | ICD-10-CM

## 2020-04-17 DIAGNOSIS — M545 Low back pain, unspecified: Secondary | ICD-10-CM

## 2020-04-17 DIAGNOSIS — M542 Cervicalgia: Secondary | ICD-10-CM | POA: Diagnosis not present

## 2020-04-17 NOTE — Progress Notes (Signed)
[facility] [address] [place] [address] Phone: [phone] Subjective:   Dennis Moreno, am serving as a scribe for Dr. Antoine Primas. This visit occurred during the SARS-CoV-2 public health emergency.  Safety protocols were in place, including screening questions prior to the visit, additional usage of staff PPE, and extensive cleaning of exam room while observing appropriate contact time as indicated for disinfecting solutions.   I'm seeing this patient by the request  of:  Dennis Moreno, [place]  CC: Low back pain  [id number]  Dennis Moreno is a 34 y.o. male coming in with complaint of back and neck pain. Patient states that he had lower back injury when pushing carts. Pain in midline of lumbar spine. Pain shot up spine to his ears. Has tried chiropractic care, stretching, and strength training. Pain also in his cervical spine.  States that the pain is just severe.  Seems to start in the lower back and then radiate up to the neck.  Patient denies much that goes to any of the extremities.  States it seems to be worse with activity.  States that his father has had significant amount of back pain as well     Past Medical History:  Diagnosis Date  . Depression    Past Surgical History:  Procedure Laterality Date  . WISDOM TOOTH EXTRACTION Bilateral 2001   Social History   Socioeconomic History  . Marital status: Single    Spouse name: Not on file  . Number of children: Not on file  . Years of education: Not on file  . Highest education level: Not on file  Occupational History  . Not on file  Tobacco Use  . Smoking status: Current Every Day Smoker    Types: Cigarettes  . Smokeless tobacco: Never Used  . Tobacco comment: 4-5 cigarettes a day  Vaping Use  . Vaping Use: Never used  Substance and Sexual Activity  . Alcohol use: Yes    Comment: 8-9 drinks a week of liquir or beer  . Drug use: Yes    Types: Marijuana    Comment:  1/4 pound a week, decreased pass to weeks smoking only one a day.  Marland Kitchen Sexual activity: Yes    Partners: Female  Other Topics Concern  . Not on file  Social History Narrative  . Not on file   Social Determinants of Health   Financial Resource Strain:   . Difficulty of Paying Living Expenses:   Food Insecurity:   . Worried About Programme researcher, broadcasting/film/video in the Last Year:   . Barista in the Last Year:   Transportation Needs:   . Freight forwarder (Medical):   Marland Kitchen Lack of Transportation (Non-Medical):   Physical Activity:   . Days of Exercise per Week:   . Minutes of Exercise per Session:   Stress:   . Feeling of Stress :   Social Connections:   . Frequency of Communication with Friends and Family:   . Frequency of Social Gatherings with Friends and Family:   . Attends Religious Services:   . Active Member of Clubs or Organizations:   . Attends Banker Meetings:   Marland Kitchen Marital Status:    Allergies  Allergen Reactions  . Hydrocodone Itching   Family History  Problem Relation Age of Onset  . Arthritis Mother   . Depression Mother   . Alcohol abuse Father   . Depression Father   .  Depression Sister   . Depression Brother   . Alcohol abuse Maternal Grandfather   . Alcohol abuse Paternal Grandfather   . Depression Paternal Grandfather   . Depression Brother        Current Outpatient Medications (Analgesics):  Marland Kitchen  Acetaminophen (MIDOL PO), Take 1-2 tablets by mouth as needed.   Current Outpatient Medications (Other):  Marland Kitchen  BEE POLLEN PO, Take 5 mLs by mouth daily.   Reviewed prior external information including notes and imaging from  primary care provider As well as notes that were available from care everywhere and other healthcare systems.  Past medical history, social, surgical and family history all reviewed in electronic medical record.  No pertanent information unless stated regarding to the chief complaint.   Review of Systems:  No  headache, visual changes, nausea, vomiting, diarrhea, constipation, dizziness, abdominal pain, skin rash, fevers, chills, night sweats, weight loss, swollen lymph nodes, body aches, joint swelling, chest pain, shortness of breath, mood changes. POSITIVE muscle aches  Objective  Blood pressure 110/68, pulse 93, height 5\' 9"  (1.753 m), weight 159 lb (72.1 kg), SpO2 97 %.   General: No apparent distress alert and oriented x3 mood and affect normal, dressed appropriately.  HEENT: Pupils equal, extraocular movements intact  Respiratory: Patient's speak in full sentences and does not appear short of breath  Cardiovascular: No lower extremity edema, non tender, no erythema  Neuro: Cranial nerves II through XII are intact, neurovascularly intact in all extremities with 2+ DTRs and 2+ pulses.  Gait normal with good balance and coordination.  MSK:  Non tender with full range of motion and good stability and symmetric strength and tone of shoulders, elbows, wrist, hip, knee and ankles bilaterally.  Low back exam shows the patient does have significant tightness with straight leg test.  Patient does have some tightness in the back overall.  Mild muscle spasm appears in the spinalis muscle on the right side of the back.  Tightness with .  Neurovascularly intact distally.    [address]; 15 additional minutes spent for Therapeutic exercises as stated in above notes.  This included exercises focusing on stretching, strengthening, with significant focus on eccentric aspects.   Long term goals include an improvement in range of motion, strength, endurance as well as avoiding reinjury. Patient's frequency would include in 1-2 times a day, 3-5 times a week for a duration of 6-12 weeks.Low back exercises that included:  Pelvic tilt/bracing instruction to focus on control of the pelvic girdle and lower abdominal muscles  Glute strengthening exercises, focusing on proper firing of the glutes without engaging the low back  muscles Proper stretching techniques for maximum relief for the hamstrings, hip flexors, low back and some rotation where tolerated    Proper technique shown and discussed handout in great detail with ATC.  All questions were discussed and answered.   Impression and Recommendations:     The above documentation has been reviewed and is accurate and complete Pearlean Brownie, DO       Note: This dictation was prepared with Dragon dictation along with smaller phrase technology. Any transcriptional errors that result from this process are unintentional.

## 2020-04-17 NOTE — Patient Instructions (Addendum)
Gabapentin 200mg  at night Xray back today Exercises 3x a week Vit D 2000IU daily Tart Cherry Extract 1200mg  at night See me in 4-6 weeks if not better will do MRI

## 2020-04-18 ENCOUNTER — Encounter: Payer: Self-pay | Admitting: Family Medicine

## 2020-04-18 DIAGNOSIS — M545 Low back pain, unspecified: Secondary | ICD-10-CM | POA: Insufficient documentation

## 2020-04-18 MED ORDER — GABAPENTIN 100 MG PO CAPS: 200.0000 mg | ORAL_CAPSULE | Freq: Every day | ORAL | 3 refills | Status: DC

## 2020-04-18 NOTE — Assessment & Plan Note (Signed)
Patient does have low back pain.  Does discuss how certain lifts because of the radiation of pain all the way up to his neck.  May need to consider the possibility of advanced imaging if this continues but on exam today only seems to be more of a muscle spasm and likely more of a multifactorial back pain.  We discussed different medications with him and he elected to try to keep it as natural as possible.  X-rays ordered today and flexion-extension to see if there is any instability of the back or any congenital stenosis that could be contributing.  Patient did bring up the idea of FMLA at work.  At this point I said that I think you could do relatively well and gave him a note that he was here today.  Would consider following up FMLA if he decides to do physical therapy regularly.  Patient will consider this but start with the home exercises and follow-up with me again in 4 to 8 weeks.  And once again if worsening symptoms will consider advanced imaging but hopefully not necessary in this young individual

## 2020-05-14 ENCOUNTER — Telehealth: Payer: Federal, State, Local not specified - PPO | Admitting: Family Medicine

## 2020-05-21 ENCOUNTER — Ambulatory Visit: Payer: Federal, State, Local not specified - PPO | Admitting: Family Medicine

## 2020-05-21 NOTE — Progress Notes (Deleted)
[facility] [address] [place] [address] Phone: [phone] Subjective:    I'm seeing this patient by the request  of:  [name], [place]  CC:   [id number]   04/17/2020 Patient does have low back pain.  Does discuss how certain lifts because of the radiation of pain all the way up to his neck.  May need to consider the possibility of advanced imaging if this continues but on exam today only seems to be more of a muscle spasm and likely more of a multifactorial back pain.  We discussed different medications with him and he elected to try to keep it as natural as possible.  X-rays ordered today and flexion-extension to see if there is any instability of the back or any congenital stenosis that could be contributing.  Patient did bring up the idea of FMLA at work.  At this point I said that I think you could do relatively well and gave him a note that he was here today.  Would consider following up FMLA if he decides to do physical therapy regularly.  Patient will consider this but start with the home exercises and follow-up with me again in 4 to 8 weeks.  And once again if worsening symptoms will consider advanced imaging but hopefully not necessary in this young individual Update 05/21/2020 Dennis Moreno is a 34 y.o. male coming in with complaint of low back pain. Patient states      xrays 8/5 independently visualized and interpreted by me on date of service. Normal lumbar xray   Past Medical History:  Diagnosis Date  . Depression    Past Surgical History:  Procedure Laterality Date  . WISDOM TOOTH EXTRACTION Bilateral 2001   Social History   Socioeconomic History  . Marital status: Single    Spouse name: Not on file  . Number of children: Not on file  . Years of education: Not on file  . Highest education level: Not on file  Occupational History  . Not on file  Tobacco Use  . Smoking status: Current Every Day Smoker    Types:  Cigarettes  . Smokeless tobacco: Never Used  . Tobacco comment: 4-5 cigarettes a day  Vaping Use  . Vaping Use: Never used  Substance and Sexual Activity  . Alcohol use: Yes    Comment: 8-9 drinks a week of liquir or beer  . Drug use: Yes    Types: Marijuana    Comment: 1/4 pound a week, decreased pass to weeks smoking only one a day.  Marland Kitchen Sexual activity: Yes    Partners: Female  Other Topics Concern  . Not on file  Social History Narrative  . Not on file   Social Determinants of Health   Financial Resource Strain:   . Difficulty of Paying Living Expenses: Not on file  Food Insecurity:   . Worried About Programme researcher, broadcasting/film/video in the Last Year: Not on file  . Ran Out of Food in the Last Year: Not on file  Transportation Needs:   . Lack of Transportation (Medical): Not on file  . Lack of Transportation (Non-Medical): Not on file  Physical Activity:   . Days of Exercise per Week: Not on file  . Minutes of Exercise per Session: Not on file  Stress:   . Feeling of Stress : Not on file  Social Connections:   . Frequency of Communication with Friends and Family: Not on file  . Frequency  of Social Gatherings with Friends and Family: Not on file  . Attends Religious Services: Not on file  . Active Member of Clubs or Organizations: Not on file  . Attends Banker Meetings: Not on file  . Marital Status: Not on file   Allergies  Allergen Reactions  . Hydrocodone Itching   Family History  Problem Relation Age of Onset  . Arthritis Mother   . Depression Mother   . Alcohol abuse Father   . Depression Father   . Depression Sister   . Depression Brother   . Alcohol abuse Maternal Grandfather   . Alcohol abuse Paternal Grandfather   . Depression Paternal Grandfather   . Depression Brother        Current Outpatient Medications (Analgesics):  Marland Kitchen  Acetaminophen (MIDOL PO), Take 1-2 tablets by mouth as needed.   Current Outpatient Medications (Other):  Marland Kitchen  BEE  POLLEN PO, Take 5 mLs by mouth daily. Marland Kitchen  gabapentin (NEURONTIN) 100 MG capsule, Take 2 capsules (200 mg total) by mouth at bedtime.   Reviewed prior external information including notes and imaging from  primary care provider As well as notes that were available from care everywhere and other healthcare systems.  Past medical history, social, surgical and family history all reviewed in electronic medical record.  No pertanent information unless stated regarding to the chief complaint.   Review of Systems:  No headache, visual changes, nausea, vomiting, diarrhea, constipation, dizziness, abdominal pain, skin rash, fevers, chills, night sweats, weight loss, swollen lymph nodes, body aches, joint swelling, chest pain, shortness of breath, mood changes. POSITIVE muscle aches  Objective  There were no vitals taken for this visit.   General: No apparent distress alert and oriented x3 mood and affect normal, dressed appropriately.  HEENT: Pupils equal, extraocular movements intact  Respiratory: Patient's speak in full sentences and does not appear short of breath  Cardiovascular: No lower extremity edema, non tender, no erythema  Neuro: Cranial nerves II through XII are intact, neurovascularly intact in all extremities with 2+ DTRs and 2+ pulses.  Gait normal with good balance and coordination.  MSK:  Non tender with full range of motion and good stability and symmetric strength and tone of shoulders, elbows, wrist, hip, knee and ankles bilaterally.     Impression and Recommendations:     The above documentation has been reviewed and is accurate and complete Judi Saa, DO       Note: This dictation was prepared with Dragon dictation along with smaller phrase technology. Any transcriptional errors that result from this process are unintentional.

## 2020-09-15 ENCOUNTER — Encounter (HOSPITAL_COMMUNITY): Payer: Self-pay | Admitting: Emergency Medicine

## 2020-09-15 ENCOUNTER — Emergency Department (HOSPITAL_COMMUNITY)
Admission: EM | Admit: 2020-09-15 | Discharge: 2020-09-15 | Discharge status: Against Medical Advice | Payer: Federal, State, Local not specified - PPO | Attending: Emergency Medicine | Admitting: Emergency Medicine

## 2020-09-15 ENCOUNTER — Other Ambulatory Visit: Payer: Self-pay

## 2020-09-15 DIAGNOSIS — U071 COVID-19: Secondary | ICD-10-CM | POA: Diagnosis not present

## 2020-09-15 DIAGNOSIS — F17210 Nicotine dependence, cigarettes, uncomplicated: Secondary | ICD-10-CM | POA: Diagnosis not present

## 2020-09-15 DIAGNOSIS — R059 Cough, unspecified: Secondary | ICD-10-CM | POA: Diagnosis not present

## 2020-09-15 LAB — POC SARS CORONAVIRUS 2 AG -  ED: SARS Coronavirus 2 Ag: POSITIVE — AB

## 2020-09-15 NOTE — ED Provider Notes (Cosign Needed)
[facility]-EMERGENCY DEPT Provider Note   CSN: [id number] Arrival date & time: 09/15/20  1044     History Chief Complaint  Patient presents with  . Cough    Dennis Moreno is a 35 y.o. male.  35 year old with no significant PMH presents with productive cough x 2 days with sweats, chills, body aches. Reports 1 episode of vomiting last night, also loose stools. No blood in emesis or stools. Not vaccinated against COVID 19, prefers to treat symptoms with vitamin C.    [name] was evaluated in Emergency Department on 09/15/2020 for the symptoms described in the history of present illness. He was evaluated in the context of the global COVID-19 pandemic, which necessitated consideration that the patient might be at risk for infection with the SARS-CoV-2 virus that causes COVID-19. Institutional protocols and algorithms that pertain to the evaluation of patients at risk for COVID-19 are in a state of rapid change based on information released by regulatory bodies including the CDC and federal and state organizations. These policies and algorithms were followed during the patient's care in the ED.         Past Medical History:  Diagnosis Date  . Depression     Patient Active Problem List   Diagnosis Date Noted  . Low back pain 04/18/2020  . Depression, major, single episode, moderate (HCC) 10/10/2017  . Anxiety 10/10/2017    Past Surgical History:  Procedure Laterality Date  . WISDOM TOOTH EXTRACTION Bilateral 2001       Family History  Problem Relation Age of Onset  . Arthritis Mother   . Depression Mother   . Alcohol abuse Father   . Depression Father   . Depression Sister   . Depression Brother   . Alcohol abuse Maternal Grandfather   . Alcohol abuse Paternal Grandfather   . Depression Paternal Grandfather   . Depression Brother     Social History   Tobacco Use  . Smoking status: Current Every Day Smoker    Types: Cigarettes   . Smokeless tobacco: Never Used  . Tobacco comment: 4-5 cigarettes a day  Vaping Use  . Vaping Use: Never used  Substance Use Topics  . Alcohol use: Yes    Comment: 8-9 drinks a week of liquir or beer  . Drug use: Yes    Types: Marijuana    Comment: 1/4 pound a week, decreased pass to weeks smoking only one a day.    Home Medications Prior to Admission medications   Medication Sig Start Date End Date Taking? Authorizing Provider  Acetaminophen (MIDOL PO) Take 1-2 tablets by mouth as needed.    [provider]  BEE POLLEN PO Take 5 mLs by mouth daily.    [provider]  gabapentin (NEURONTIN) 100 MG capsule Take 2 capsules (200 mg total) by mouth at bedtime. 04/18/20   Judi Saa, DO    Allergies    Hydrocodone  Review of Systems   Review of Systems  Constitutional: Positive for chills. Negative for fever.  HENT: Positive for congestion.   Respiratory: Positive for cough.   Gastrointestinal: Positive for diarrhea, nausea and vomiting. Negative for abdominal pain and blood in stool.  Musculoskeletal: Positive for arthralgias and myalgias.  Skin: Negative for rash.  Allergic/Immunologic: Negative for immunocompromised state.  All other systems reviewed and are negative.   Physical Exam Updated Vital Signs BP 117/78   Pulse 72   Temp 98.6 F (37 C) (Oral)  Resp 16   SpO2 98%   Physical Exam Vitals and nursing note reviewed.  Constitutional:      General: He is not in acute distress.    Appearance: He is well-developed and well-nourished. He is not diaphoretic.  HENT:     Head: Normocephalic and atraumatic.     Mouth/Throat:     Mouth: Mucous membranes are moist.  Eyes:     Conjunctiva/sclera: Conjunctivae normal.  Cardiovascular:     Rate and Rhythm: Normal rate and regular rhythm.     Heart sounds: Normal heart sounds.  Pulmonary:     Effort: Pulmonary effort is normal.     Breath sounds: Normal breath sounds.  Musculoskeletal:      Cervical back: Neck supple.  Lymphadenopathy:     Cervical: No cervical adenopathy.  Skin:    General: Skin is warm and dry.     Findings: No erythema or rash.  Neurological:     Mental Status: He is alert and oriented to person, place, and time.  Psychiatric:        Mood and Affect: Mood and affect normal.        Behavior: Behavior normal.     ED Results / Procedures / Treatments   Labs (all labs ordered are listed, but only abnormal results are displayed) Labs Reviewed  POC SARS CORONAVIRUS 2 AG -  ED    EKG None  Radiology No results found.  Procedures Procedures (including critical care time)  Medications Ordered in ED Medications - No data to display  ED Course  I have reviewed the triage vital signs and the nursing notes.  Pertinent labs & imaging results that were available during my care of the patient were reviewed by me and considered in my medical decision making (see chart for details).  Clinical Course as of 09/15/20 1457  Mon Sep 15, 2020  4463 35 year old male presents with 2 days of URI symptoms with one episode of vomiting and some loose stools.  Patient is monitoring his O2 sat at home and became concerned when his O2 reading was 87%.  On exam the patient is well-appearing, lung sounds are clear, vitals reassuring with O2 sats of 98% on room air. Rapid Covid swab obtained, consider discharge for viral URI. [LM]    Clinical Course User Index [LM] Alden Hipp   MDM Rules/Calculators/A&P                          Final Clinical Impression(s) / ED Diagnoses Final diagnoses:  None    Rx / DC Orders ED Discharge Orders    None       Jeannie Fend, PA-C 09/15/20 1457

## 2020-09-15 NOTE — ED Triage Notes (Signed)
Patient c/o cough, chills, and body aches x2 days. 

## 2020-09-15 NOTE — ED Notes (Signed)
Pt stated that he could not wait any longer and walked out the front door.

## 2020-09-15 NOTE — ED Notes (Signed)
ED Provider at bedside. 

## 2020-09-15 NOTE — ED Notes (Signed)
Called for room placement x1 with no answer. 

## 2020-09-15 NOTE — Discharge Instructions (Signed)
If your rapid COVID test is positive, home to quarantine until day 10 of symptoms. IF your COIVD test is negative, a PCR test will be sent out, this result takes 6 to 24 hours and will be available in your MyChart account when complete.  Consider Motrin and Tylenol for body aches as needed as directed.  Mucinex as needed for congestion and cough. Follow-up with your PCP as needed, return to ER for severe concerning symptoms.

## 2020-09-15 NOTE — ED Notes (Signed)
Poc covid pos given to provider

## 2020-09-15 NOTE — ED Provider Notes (Signed)
  Physical Exam  BP 117/78   Pulse 72   Temp 98.6 F (37 C) (Oral)   Resp 16   SpO2 98%   Physical Exam  ED Course/Procedures   Clinical Course as of 09/15/20 1526  Mon Sep 15, 2020  5526 35 year old male presents with 2 days of URI symptoms with one episode of vomiting and some loose stools.  Patient is monitoring his O2 sat at home and became concerned when his O2 reading was 87%.  On exam the patient is well-appearing, lung sounds are clear, vitals reassuring with O2 sats of 98% on room air. Rapid Covid swab obtained, consider discharge for viral URI. [LM]    Clinical Course User Index [LM] Jeannie Fend, PA-C    Procedures  MDM  Received shift handoff report from Army Melia PA-C.  He had pending POC SARS coronavirus test pending.  I was contacted by the nurse tech who reported patient's test was positive.  I went to speak with the patient about his positive test results and was informed by the nurse that he had eloped.  Patient's nurse reported that she informed the patient he could obtain his test results on his MyChart.        Haskel Schroeder, PA-C 09/15/20 1527    Alvira Monday, MD 09/16/20 [phone]

## 2020-10-15 ENCOUNTER — Telehealth: Payer: Federal, State, Local not specified - PPO | Admitting: Family Medicine

## 2020-10-15 DIAGNOSIS — U071 COVID-19: Secondary | ICD-10-CM | POA: Diagnosis not present

## 2020-10-15 MED ORDER — ONDANSETRON 4 MG PO TBDP: 4.0000 mg | ORAL_TABLET | Freq: Three times a day (TID) | ORAL | 0 refills | Status: DC | PRN

## 2020-10-15 NOTE — Progress Notes (Signed)
[name], Dennis Moreno are scheduled for a virtual visit with your provider today.    Just as we do with appointments in the office, we must obtain your consent to participate.  Your consent will be active for this visit and any virtual visit you may have with one of our providers in the next 365 days.    If you have a MyChart account, I can also send a copy of this consent to you electronically.  All virtual visits are billed to your insurance company just like a traditional visit in the office.  As this is a virtual visit, video technology does not allow for your provider to perform a traditional examination.  This may limit your provider's ability to fully assess your condition.  If your provider identifies any concerns that need to be evaluated in person or the need to arrange testing such as labs, EKG, etc, we will make arrangements to do so.    Although advances in technology are sophisticated, we cannot ensure that it will always work on either your end or our end.  If the connection with a video visit is poor, we may have to switch to a telephone visit.  With either a video or telephone visit, we are not always able to ensure that we have a secure connection.   I need to obtain your verbal consent now.   Are you willing to proceed with your visit today?   Dennis Moreno has provided verbal consent on 10/15/2020 for a virtual visit (video or telephone).   Alric Seton, MD 10/15/2020  12:35 PM   Choose 1 Note Type (Video or Telephone):    Virtual Visit via Video Note   This visit type was conducted due to national recommendations for restrictions regarding the COVID-19 Pandemic (e.g. social distancing) in an effort to limit this patient's exposure and mitigate transmission in our community.  Due to his co-morbid illnesses, this patient is at least at moderate risk for complications without adequate follow up.  This format is felt to be most appropriate for this patient at this time.  All issues  noted in this document were discussed and addressed.  A limited physical exam was performed with this format.  Please refer to the patient's chart for his consent to telehealth for [facility].     Evaluation Performed:  Follow-up visit  Date:  10/15/2020   ID:  Dennis Moreno, DOB [birth year], MRN [id number]  Patient Location: Home Provider Location: Home Office   Participants: Patient and Provider for Visit and Wrap up  Method of visit: Video Location of Patient: Home Location of Provider: Home Office Consent was obtain for visit over the telephone. Services rendered by provider: Visit was performed via video  A video enabled telemedicine application was used and I verified that I am speaking with the correct person using two identifiers.  PCP:  [name], PA   Chief Complaint:  Nausea post COVID  History of Present Illness:    Dennis Moreno is a 35 y.o. male with PMH anxiety/depression presents for evaluation of nausea.  Patient was initially very nauseas 1 month ago and tested positive for COVID 1/3. His nausea has since improved but he has continued to have 3 episodes per week of nausea. No associated emesis. He has decreased PO intake during these episodes, but is otherwise eating/drinking normally. He does feel overall tired/drained. He denies any associated diarrhea. He has not taken any meds for this issue. He denies respiratory  symptoms including cough, congestion, sob, cp, palpitations. He denies fever/chills. He denies associated abdominal pain. He is unvaccinated.  Past Medical, Surgical, Social History, Allergies, and Medications have been Reviewed.  Past Medical History:  Diagnosis Date  . Depression    Past Surgical History:  Procedure Laterality Date  . WISDOM TOOTH EXTRACTION Bilateral 2001     Current Meds  Medication Sig  . ondansetron (ZOFRAN ODT) 4 MG disintegrating tablet Take 1 tablet (4 mg total) by mouth every 8 (eight) hours as  needed for nausea or vomiting.     Allergies:   Hydrocodone   ROS:   Please see the history of present illness.     All other systems reviewed and are negative.   Labs/Other Tests and Data Reviewed:    Recent Labs: No results found for requested labs within last 8760 hours.   Wt Readings from Last 3 Encounters:  04/17/20 159 lb (72.1 kg)  04/02/20 151 lb 6.1 oz (68.7 kg)  10/24/17 137 lb (62.1 kg)     Objective:    Vital Signs:  There were no vitals taken for this visit.   VITAL SIGNS:  reviewed GEN:  no acute distress EYES:  sclerae anicteric, EOMI - Extraocular Movements Intact RESPIRATORY:  normal respiratory effort, symmetric expansion CARDIOVASCULAR:  regular rate and rhythm on self pulse check SKIN:  no rash, lesions or ulcers. MUSCULOSKELETAL:  no obvious deformities. NEURO:  alert and oriented x 3, no obvious focal deficit PSYCH:  normal affect  ASSESSMENT & PLAN:    Nausea associated with COVID 19 Patient presents 1 month after COVID diagnosis with continued but improved nausea, 3 episodes/week. No other associated symptoms. He is also fatigued. Patient is well appearing and does not appear dehydrated on my limited exam. Discussed with patient that this is likely a continued effect of his COVID that will improve with time. Discussed conservative management including BRAT diet, increased hydration, small, frequent meals. Rx for zofran sent to pharmacy and patient provided with work note per request. Discussed that if symptoms dont improve in 3-4 weeks he should seek care in person for further evaluation and workup. Patient amendable with plan and voiced understanding.  Time:  Today, I have spent 15 minutes with the patient with telehealth technology discussing the above problems.     Medication Adjustments/Labs and Tests Ordered: Current medicines are reviewed at length with the patient today.  Concerns regarding medicines are outlined above.   Tests  Ordered: No orders of the defined types were placed in this encounter.   Medication Changes: Meds ordered this encounter  Medications  . ondansetron (ZOFRAN ODT) 4 MG disintegrating tablet    Sig: Take 1 tablet (4 mg total) by mouth every 8 (eight) hours as needed for nausea or vomiting.    Dispense:  20 tablet    Refill:  0     Disposition:  Follow up  Signed, Alric Seton, MD  10/15/2020 12:36 PM

## 2020-10-15 NOTE — Patient Instructions (Signed)
-take zofran under tongue, let dissolve when nauseas, up to every 8 hours -drink plenty of water -try to eat bland foods that are easy to digest  Nausea, Adult Nausea is feeling sick to your stomach or feeling that you are about to throw up (vomit). Feeling sick to your stomach is usually not serious, but it may be an early sign of a more serious medical problem. As you feel sicker to your stomach, you may throw up. If you throw up, or if you are not able to drink enough fluids, there is a risk that you may lose too much water in your body (get dehydrated). If you lose too much water in your body, you may:  Feel tired.  Feel thirsty.  Have a dry mouth.  Have cracked lips.  Go pee (urinate) less often. Older adults and people who have other diseases or a weak body defense system (immune system) have a higher risk of losing too much water in the body. The main goals of treating this condition are:  To relieve your nausea.  To ensure your nausea occurs less often.  To prevent throwing up and losing too much fluid. Follow these instructions at home: Watch your symptoms for any changes. Tell your doctor about them. Follow these instructions as told by your doctor. Eating and drinking  Take an ORS (oral rehydration solution). This is a drink that is sold at pharmacies and stores.  Drink clear fluids in small amounts as you are able. These include: ? Water. ? Ice chips. ? Fruit juice that has water added (diluted fruit juice). ? Low-calorie sports drinks.  Eat bland, easy-to-digest foods in small amounts as you are able, such as: ? Bananas. ? Applesauce. ? Rice. ? Low-fat (lean) meats. ? Toast. ? Crackers.  Avoid drinking fluids that have a lot of sugar or caffeine in them. This includes energy drinks, sports drinks, and soda.  Avoid alcohol.  Avoid spicy or fatty foods.      General instructions  Take over-the-counter and prescription medicines only as told by your  doctor.  Rest at home while you get better.  Drink enough fluid to keep your pee (urine) pale yellow.  Take slow and deep breaths when you feel sick to your stomach.  Avoid food or things that have strong smells.  Wash your hands often with soap and water. If you cannot use soap and water, use hand sanitizer.  Make sure that all people in your home wash their hands well and often.  Keep all follow-up visits as told by your doctor. This is important. Contact a doctor if:  You feel sicker to your stomach.  You feel sick to your stomach for more than 2 days.  You throw up.  You are not able to drink fluids without throwing up.  You have new symptoms.  You have a fever.  You have a headache.  You have muscle cramps.  You have a rash.  You have pain while peeing.  You feel light-headed or dizzy. Get help right away if:  You have pain in your chest, neck, arm, or jaw.  You feel very weak or you pass out (faint).  You have throw up that is bright red or looks like coffee grounds.  You have bloody or black poop (stools) or poop that looks like tar.  You have a very bad headache, a stiff neck, or both.  You have very bad pain, cramping, or bloating in your belly (abdomen).  You have trouble breathing or you are breathing very quickly.  Your heart is beating very quickly.  Your skin feels cold and clammy.  You feel confused.  You have signs of losing too much water in your body, such as: ? Dark pee, very little pee, or no pee. ? Cracked lips. ? Dry mouth. ? Sunken eyes. ? Sleepiness. ? Weakness. These symptoms may be an emergency. Do not wait to see if the symptoms will go away. Get medical help right away. Call your local emergency services (911 in the [place] Do not drive yourself to the hospital. Summary  Nausea is feeling sick to your stomach or feeling that you are about to throw up (vomit).  If you throw up, or if you are not able to drink enough  fluids, there is a risk that you may lose too much water in your body (get dehydrated).  Eat and drink what your doctor tells you. Take over-the-counter and prescription medicines only as told by your doctor.  Contact a doctor right away if your symptoms get worse or you have new symptoms.  Keep all follow-up visits as told by your doctor. This is important. This information is not intended to replace advice given to you by your health care provider. Make sure you discuss any questions you have with your health care provider. Document Revised: 07/31/2019 Document Reviewed: 02/07/2018 Elsevier Patient Education  2021 Elsevier Inc.  AREA FAMILY PRACTICE PHYSICIANS  [place] ([id number]) . [facility] o [address] [place] [place], [place] [address] o [phone] o Mon-Fri 8:30-12:30, 1:30-5:00 o Accepting Medicaid . [facility] Medicine at [facility] [address], [place], [place] [address] o [phone] o Mon-Fri 8:00-5:30 . [place] [employer] o [address], [place], [place] [address] o [phone], Tue, Thur, Fri 8:30-5:00, Wed 10:00-7:00 (closed 1-2pm) o Accepting Medicaid . [facility] o [address], [place], [place]  [address] o Phone - [phone]   Fax - [phone]  [place] [phone]) . [facility] Medicine o [address], [place], [place] [address] o [phone] o Mon-Fri 8:00-5:00 . [facility] Pediatrics at [facility])  o [address] [place] [place], [place] [address] o [phone] o Mon-Fri 8:30-5:30, Sat (Oct.-Mar.) 9:00-1:00 o Accepting [facility] [phone]) . [facility] at [facility] o [address] [place], [place] [address] o [phone] o Mon-Fri 8:00-5:00  [place] [phone]) . [facility] Medicine at [facility] o [address] [place],  [place] [address] o [phone] o Mon-Fri 8:00-5:00 . Nature conservation officer at [place] o [address] [place], [place], [place] [address] o [phone] o Mon-Fri 8:00-5:00 . Nature conservation officer at [employer] o [address] [place], [place] [address] o [phone] o Mon-Fri 8:00-5:00 . [facility] o [address] [place] [address] o [phone] o Mon-Fri 7:30-5:30  [place] [phone] & [phone]) . [place] o [address] [place], [place] [address] o [phone] o Mon-Thur 8:00-6:00 o Accepting Medicaid . [facility] Medicine o [address] [place], [place] [address] o [phone] o Mon-Thur 7:30-7:30, Fri 7:30-4:30 o Accepting Medicaid . [facility] at [facility] o [phone] [address], [place], [place]  [address] o [phone]   Fax - [phone]  [place] [phone] & [phone]) . Nature conservation officer at [employer] o [address] [place], [place] [address] o [phone] o Mon-Fri 7:00-5:00 . [facility] Medicine o [address], [place], [place] [address] o [phone] o Mon-Fri 8:00-5:00 o Accepting Medicaid . [facility] - [facility] o [address] [place], [place] [address] o [phone] o Mon-Fri 8:00-5:00 o Accepting Medicaid  [facility] [phone]) . [facility] at [facility] o [address] [place], [place] [address] o [phone] o Mon-Fri 8:00-5:00 . [facility] - [facility] Family Medicine at [employer]) o [address], [place], [place] [address] o [phone] o Mon-Fri 8:00-5:00 o Accepting Medicaid . [facility] - [facility] at [employer]) o [address], [place], [place] [address] o [phone] o Mon-Fri 8:00-5:30, Sat&Sun by appointment  (phones open at 8:30) o Accepting [facility] [phone] & [phone]) . [facility] Medicine o [address] [place], [place] [address] o [phone] o Mon-Thur 8:00-7:00, Fri 8:00-5:00, Sat 8:00-12:00, Sun 9:00-12:00 o Accepting Medicaid . [facility] - Family Medicine at [facility] [address], [place], [place] [address] o [phone] o Mon-Thur 8:00-5:00 o Accepting Medicaid . [facility] - Family Medicine at [place] o [address] [place] [place], [place] [address] o [phone] o Mon-Fri 8:00-5:30, Sat (Oct.-Mar.) 9:00-1:00 o Accepting [facility] [phone]) . [facility] o [address] [place], [place] [address] o [phone] o Mon-Fri 8:00-5:00 o Accepting Medicaid   [facility] [phone]) . [facility] at [facility] o [address], [place], [place] [address] o [phone] o Mon-Fri 8:00-5:00 . Nature conservation officer at [facility] o [address], [place], [place] [address] o [phone] o Mon-Fri 8:00-5:00 . [facility] [place] o [address], [place], [place] [address] o [phone] o Mon-Fri 8:00-5:00 o After hours clinic [facility] [place], [place] [address]) [phone] Mon-Fri 5:00-8:00, Sat 12:00-6:00, Sun 10:00-4:00 o Accepting Medicaid . [facility] at [facility] o [address] [place], [place]  [address] o [phone]   Fax - [phone]  [place] [phone]) . Nature conservation officer at [facility] o [address], [place], [place] [address] o [phone] o Mon-Fri 8:00-5:00 . [facility] Family Medicine - [place] [facility]) o [address], [place], [place] [address] o [phone] o Mon-Thur 8:00-7:00, Fri 8:00-5:00, Sat 8:00-12:00

## 2020-10-17 ENCOUNTER — Telehealth: Payer: Federal, State, Local not specified - PPO | Admitting: Family Medicine

## 2020-10-17 DIAGNOSIS — G4709 Other insomnia: Secondary | ICD-10-CM | POA: Diagnosis not present

## 2020-10-17 DIAGNOSIS — Z8616 Personal history of COVID-19: Secondary | ICD-10-CM

## 2020-10-17 DIAGNOSIS — R110 Nausea: Secondary | ICD-10-CM

## 2020-10-17 MED ORDER — ZOLPIDEM TARTRATE 5 MG PO TABS: 5.0000 mg | ORAL_TABLET | Freq: Every evening | ORAL | 0 refills | Status: DC | PRN

## 2020-10-17 NOTE — Patient Instructions (Signed)
Practice good sleep hygiene.  Stick to a sleep schedule, even on weekends. Exercise is great, but not too late in the day Avoid alcoholic drinks before bed Avoid large meals and beverages late before bed Don't take naps after 3 pm. Keep power naps less than 1 hour.  Relax before bed.  Take a hot bath before bed.  Have a good sleeping environment. Get rid of anything in your bedroom that might distract you from sleep.  Adopt good sleeping posture.   Will also start a trial of Ambien 5 mg at bedtime as needed for insomnia. Refrain from drinking alcohol, driving, or operating machinery while taking this medication.   Thanks for allowing [place] to be apart of your care.

## 2020-10-17 NOTE — Progress Notes (Signed)
Mr. minas, bonser are scheduled for a virtual visit with your provider today.    Just as we do with appointments in the office, we must obtain your consent to participate.  Your consent will be active for this visit and any virtual visit you may have with one of our providers in the next 365 days.    If you have a MyChart account, I can also send a copy of this consent to you electronically.  All virtual visits are billed to your insurance company just like a traditional visit in the office.  As this is a virtual visit, video technology does not allow for your provider to perform a traditional examination.  This may limit your provider's ability to fully assess your condition.  If your provider identifies any concerns that need to be evaluated in person or the need to arrange testing such as labs, EKG, etc, we will make arrangements to do so.    Although advances in technology are sophisticated, we cannot ensure that it will always work on either your end or our end.  If the connection with a video visit is poor, we may have to switch to a telephone visit.  With either a video or telephone visit, we are not always able to ensure that we have a secure connection.   I need to obtain your verbal consent now.   Are you willing to proceed with your visit today?   [name] has provided verbal consent on 10/17/2020 for a virtual visit (video or telephone).   Julianne Handler, FNP 10/17/2020  1:31 PM  Virtual Visit via Video Note  I connected with [name] on 10/17/20 at  1:30 PM EST by a video enabled telemedicine application and verified that I am speaking with the correct person using two identifiers.  Location: Patient: Home Provider: [place] [facility]   I discussed the limitations of evaluation and management by telemedicine and the availability of in person appointments. The patient expressed understanding and agreed to proceed.  History of Present Illness: Dennis Moreno is a 35 year old male that presents via video with complaints of insomnia and nausea for the past several weeks. Patient was diagnosed with COVID on 09/15/2020 and has been experiencing these symptoms since that time. Patient was last evaluated by provider on 10/15/2020 for this problem.  He says that nausea typically occurs intermittently. He has not identified any aggravating factors concerning nausea. Patient has been chewing ginger root and taking OTC hemp for nausea with some relief. He says that nausea is occurring several times per week. He says that symptoms are slowly improving.   Patient has complaints of insomnia over the past several weeks. He says that he has had difficulty falling asleep and staying asleep since being diagnosed with COVID 19. He typically gets 2-3 hours of sleep per night. Patient has attempted OTC melatonin without relief. He denies any headache, anxiety, or depression. No dizziness or paresthesias. Patient has not been evaluated for this problem in the past.    Past Medical History:  Diagnosis Date  . Depression    Social History   Socioeconomic History  . Marital status: Single    Spouse name: Not on file  . Number of children: Not on file  . Years of education: Not on file  . Highest education level: Not on file  Occupational History  . Not on file  Tobacco Use  . Smoking status: Current Every Day Smoker  Types: Cigarettes  . Smokeless tobacco: Never Used  . Tobacco comment: 4-5 cigarettes a day  Vaping Use  . Vaping Use: Never used  Substance and Sexual Activity  . Alcohol use: Yes    Comment: 8-9 drinks a week of liquir or beer  . Drug use: Yes    Types: Marijuana    Comment: 1/4 pound a week, decreased pass to weeks smoking only one a day.  [place] Kitchen Sexual activity: Yes    Partners: Female  Other Topics Concern  . Not on file  Social History Narrative  . Not on file   Social Determinants of Health   Financial Resource Strain: Not on file   Food Insecurity: Not on file  Transportation Needs: Not on file  Physical Activity: Not on file  Stress: Not on file  Social Connections: Not on file  Intimate Partner Violence: Not on file    There is no immunization history on file for this patient. Allergies  Allergen Reactions  . Hydrocodone Itching     Assessment and Plan: 1. Other insomnia Will start a trial of ambien for insomnia.  Patient advised to follow up with PCP if problem persists - zolpidem (AMBIEN) 5 MG tablet; Take 1 tablet (5 mg total) by mouth at bedtime as needed for sleep.  Dispense: 15 tablet; Refill: 0  2. Nausea Continue Zofran 4 mg every 8 hours as needed.  Advance diet as tolerated  3. History of COVID-19 Patient was diagnosed with COVID on 09/15/2020, he is outside of window of infectivity.    Follow Up Instructions:    I discussed the assessment and treatment plan with the patient. The patient was provided an opportunity to ask questions and all were answered. The patient agreed with the plan and demonstrated an understanding of the instructions.   The patient was advised to call back or seek an in-person evaluation if the symptoms worsen or if the condition fails to improve as anticipated.  I provided 10 minutes of non-face-to-face time during this encounter. Started via video and transitioned to telephone due to issues with sound.    Nolon Nations  APRN, MSN, FNP-C Patient Care [facility] [address] [place], [place] [address] [phone]

## 2020-10-21 ENCOUNTER — Ambulatory Visit (INDEPENDENT_AMBULATORY_CARE_PROVIDER_SITE_OTHER): Payer: Federal, State, Local not specified - PPO | Admitting: Licensed Clinical Social Worker

## 2020-10-21 ENCOUNTER — Other Ambulatory Visit: Payer: Self-pay

## 2020-10-21 DIAGNOSIS — F1010 Alcohol abuse, uncomplicated: Secondary | ICD-10-CM | POA: Diagnosis not present

## 2020-10-21 DIAGNOSIS — F3340 Major depressive disorder, recurrent, in remission, unspecified: Secondary | ICD-10-CM
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
## 2020-10-29 DIAGNOSIS — M9907 Segmental and somatic dysfunction of upper extremity: Secondary | ICD-10-CM | POA: Diagnosis not present

## 2020-10-29 DIAGNOSIS — M9901 Segmental and somatic dysfunction of cervical region: Secondary | ICD-10-CM | POA: Diagnosis not present

## 2020-10-29 DIAGNOSIS — M9903 Segmental and somatic dysfunction of lumbar region: Secondary | ICD-10-CM | POA: Diagnosis not present

## 2020-10-29 DIAGNOSIS — M9902 Segmental and somatic dysfunction of thoracic region: Secondary | ICD-10-CM | POA: Diagnosis not present

## 2020-10-30 ENCOUNTER — Ambulatory Visit (INDEPENDENT_AMBULATORY_CARE_PROVIDER_SITE_OTHER): Payer: Federal, State, Local not specified - PPO | Admitting: Licensed Clinical Social Worker

## 2020-10-30 ENCOUNTER — Other Ambulatory Visit: Payer: Self-pay

## 2020-10-30 DIAGNOSIS — F1994 Other psychoactive substance use, unspecified with psychoactive substance-induced mood disorder: Secondary | ICD-10-CM

## 2020-10-30 DIAGNOSIS — F1010 Alcohol abuse, uncomplicated: Secondary | ICD-10-CM | POA: Diagnosis not present

## 2020-10-30 NOTE — Progress Notes (Signed)
THERAPIST PROGRESS NOTE  Session Time: 8:15am-9am  Participation Level: Active  Behavioral Response: CasualAlertAnxious and Depressed  Type of Therapy: Individual Therapy  Treatment Goals addressed: Coping and Diagnosis: stop alcohol use 7/7 days weekly  Interventions: CBT, Motivational Interviewing and Supportive  Summary: Dennis Moreno is a 35 y.o. male who presents with alcohol abuse, and symptoms of anxiety and depression. Client identified several moments of panic over previous week however struggled to identify interactions or thoughts immediately prior to feeling of anxiety. Client reports increased calling out of work due to anxiety symptoms. Client is not interested in medication management at this time. Client states struggling to identify 'fact vs feeling' and maintain stable mood causing self isolation to avoid arguments in personal and professional life. Client showed progress toward goal AEB maintaining sobriety since assessment however is struggling with isolation and implimentation of thought stopping skills.  Suicidal/Homicidal: Nowithout intent/plan  Therapist Response: Clinician met with client, assessed for SI/HI/psychosis and overall level of functioning including substance use. Clinician normalized mental and physical symptoms of early recovery. Clinician provided butterfly tapping as distress tolerance skill to manage anxiety. Clinician presented fact vs feeling and reviewed common distorted thinking styles with client. Clinician reviewed CBT triangle and the importance of identifying unhealthy thoughts. Clinician encouraged client to start with addressing physical symptoms of anxiety/depression in order to continue tasks in daily life. Client provided homework to identify negative self talk in relation to distorted thinking.  Plan: Return again in 1-2 weeks.  Diagnosis: Axis I: Alcohol Abuse, MDD vs substance induced mood disorder        Olegario Messier,  LCSW 10/30/2020

## 2020-11-04 ENCOUNTER — Encounter (INDEPENDENT_AMBULATORY_CARE_PROVIDER_SITE_OTHER): Payer: Self-pay

## 2020-11-04 ENCOUNTER — Encounter: Payer: Self-pay | Admitting: Family Medicine

## 2020-11-04 DIAGNOSIS — M9903 Segmental and somatic dysfunction of lumbar region: Secondary | ICD-10-CM | POA: Diagnosis not present

## 2020-11-04 DIAGNOSIS — M9907 Segmental and somatic dysfunction of upper extremity: Secondary | ICD-10-CM | POA: Diagnosis not present

## 2020-11-04 DIAGNOSIS — M9901 Segmental and somatic dysfunction of cervical region: Secondary | ICD-10-CM | POA: Diagnosis not present

## 2020-11-04 DIAGNOSIS — M9902 Segmental and somatic dysfunction of thoracic region: Secondary | ICD-10-CM | POA: Diagnosis not present

## 2020-11-07 DIAGNOSIS — M9902 Segmental and somatic dysfunction of thoracic region: Secondary | ICD-10-CM | POA: Diagnosis not present

## 2020-11-07 DIAGNOSIS — M9907 Segmental and somatic dysfunction of upper extremity: Secondary | ICD-10-CM | POA: Diagnosis not present

## 2020-11-07 DIAGNOSIS — M9901 Segmental and somatic dysfunction of cervical region: Secondary | ICD-10-CM | POA: Diagnosis not present

## 2020-11-07 DIAGNOSIS — M9903 Segmental and somatic dysfunction of lumbar region: Secondary | ICD-10-CM | POA: Diagnosis not present

## 2020-11-12 ENCOUNTER — Telehealth: Payer: Federal, State, Local not specified - PPO | Admitting: Family Medicine

## 2020-11-12 ENCOUNTER — Encounter: Payer: Self-pay | Admitting: Family Medicine

## 2020-11-12 DIAGNOSIS — J069 Acute upper respiratory infection, unspecified: Secondary | ICD-10-CM

## 2020-11-12 MED ORDER — BENZONATATE 100 MG PO CAPS: 100.0000 mg | ORAL_CAPSULE | Freq: Three times a day (TID) | ORAL | 0 refills | Status: DC | PRN

## 2020-11-12 MED ORDER — FLUTICASONE PROPIONATE 50 MCG/ACT NA SUSP: 2.0000 | Freq: Every day | NASAL | 0 refills | Status: DC

## 2020-11-12 NOTE — Progress Notes (Signed)
Dennis Moreno, Dennis Moreno are scheduled for a virtual visit with your provider today.    Just as we do with appointments in the office, we must obtain your consent to participate.  Your consent will be active for this visit and any virtual visit you may have with one of our providers in the next 365 days.    If you have a MyChart account, I can also send a copy of this consent to you electronically.  All virtual visits are billed to your insurance company just like a traditional visit in the office.  As this is a virtual visit, video technology does not allow for your provider to perform a traditional examination.  This may limit your provider's ability to fully assess your condition.  If your provider identifies any concerns that need to be evaluated in person or the need to arrange testing such as labs, EKG, etc, we will make arrangements to do so.    Although advances in technology are sophisticated, we cannot ensure that it will always work on either your end or our end.  If the connection with a video visit is poor, we may have to switch to a telephone visit.  With either a video or telephone visit, we are not always able to ensure that we have a secure connection.   I need to obtain your verbal consent now.   Are you willing to proceed with your visit today?   Dennis Moreno has provided verbal consent on 11/12/2020 for a virtual visit (video or telephone).   Dennis Finner, NP 11/12/2020  10:43 AM   Date:  11/12/2020   ID:  Dennis Moreno, DOB 1985/11/17, MRN [id number]  Patient Location: Home Provider Location: Home Office   Participants: Patient and Provider for Visit and Wrap up  Method of visit: Video  Location of Patient: Home Location of Provider: Home Office Consent was obtain for visit over the video. Services rendered by provider: Visit was performed via video  A video enabled telemedicine application was used and I verified that I am speaking with the correct person using two  identifiers.  PCP:  Dennis Motto, PA   Chief Complaint:    History of Present Illness:    Dennis Moreno is a 35 y.o. male with history as stated below. Presents video telehealth for an acute care visit   Onset of symptoms was Monday and symptoms have been persistent and include: Feeling dry, body aches, nasal congestion- sneezing and coughing. Denies shortness of breath. Denies ear pain. Mild sore throat from being dry.  Reports having a sick baby but was never tested for COVID.   Did have COVID back in Jan- as per chart.  Withdrawing from tobacco, marijuana, alcohol, sex. Stopped about month. Sleep was poor when he started this, and was unsure if it caused him to get sick. But, is doing better with them.  Past Medical, Surgical, Social History, Allergies, and Medications have been Reviewed.  Past Medical History:  Diagnosis Date  . Depression     No outpatient medications have been marked as taking for the 11/12/20 encounter (Appointment) with Dennis Finner, NP.     Allergies:   Hydrocodone   ROS See HPI for history of present illness.  Physical Exam Constitutional:      Appearance: Normal appearance.  HENT:     Head: Normocephalic and atraumatic.     Right Ear: External ear normal.     Left Ear: External ear normal.  Nose: Nose normal.     Comments: Nasal tone in conversation Eyes:     Conjunctiva/sclera: Conjunctivae normal.  Musculoskeletal:        General: Normal range of motion.     Cervical back: Normal range of motion.  Neurological:     Mental Status: He is alert and oriented to person, place, and time.  Psychiatric:        Mood and Affect: Mood normal.        Behavior: Behavior normal.        Thought Content: Thought content normal.        Judgment: Judgment normal.    A&P  1. URI with cough and congestion - most likely not COVID given recent illness with it -S&S sound like allergy vs URI (baby was sick as well) -flonase and perles  given for symptom management -advised to follow up in a week with pcp if not improved -OTC measures reviewed -pt verbalized understanding  - fluticasone (FLONASE) 50 MCG/ACT nasal spray; Place 2 sprays into both nostrils daily.  Dispense: 16 g; Refill: 0 - benzonatate (TESSALON) 100 MG capsule; Take 1 capsule (100 mg total) by mouth 3 (three) times daily as needed for cough.  Dispense: 30 capsule; Refill: 0   Time:   Today, I have spent 10 minutes with the patient with telehealth technology discussing the above problems, reviewing the chart, previous notes, medications and orders.    Tests Ordered: No orders of the defined types were placed in this encounter.   Medication Changes: No orders of the defined types were placed in this encounter.    Disposition:  Follow up as needed  Signed, Dennis Finner, NP  11/12/2020 10:43 AM

## 2020-11-12 NOTE — Patient Instructions (Signed)
Viral Respiratory Infection A viral respiratory infection is an illness that affects parts of the body that are used for breathing. These include the lungs, nose, and throat. It is caused by a germ called a virus. It is not treated with antibiotics.  Some examples of this kind of infection are:  A cold.  The flu (influenza).  A respiratory syncytial virus (RSV) infection. A person who gets this illness may have the following symptoms:  A stuffy or runny nose.  Yellow or green fluid in the nose.  A cough.  Sneezing.  Tiredness (fatigue).  Achy muscles.  A sore throat.  Sweating or chills.  A fever.  A headache. Follow these instructions at home: Managing pain and congestion  Take over-the-counter and prescription medicines only as told by your doctor.  If you have a sore throat, gargle with salt water. Do this 3-4 times per day or as needed. To make a salt-water mixture, dissolve -1 tsp of salt in 1 cup of warm water. Make sure that all the salt dissolves.  Use nose drops made from salt water. This helps with stuffiness (congestion). It also helps soften the skin around your nose.  Drink enough fluid to keep your pee (urine) pale yellow. General instructions  Rest as much as possible.  Do not drink alcohol.  Do not use any products that have nicotine or tobacco, such as cigarettes and e-cigarettes. If you need help quitting, ask your doctor.  Keep all follow-up visits as told by your doctor. This is important.   How is this prevented?  Get a flu shot every year. Ask your doctor when you should get your flu shot.  Do not let other people get your germs. If you are sick: ? Stay home from work or school. ? Wash your hands with soap and water often. Wash your hands after you cough or sneeze. If soap and water are not available, use hand sanitizer.  Avoid contact with people who are sick during cold and flu season. This is in fall and winter.   Get help  if:  Your symptoms last for 10 days or longer.  Your symptoms get worse over time.  You have a fever.  You have very bad pain in your face or forehead.  Parts of your jaw or neck become very swollen. Get help right away if:  You feel pain or pressure in your chest.  You have shortness of breath.  You faint or feel like you will faint.  You keep throwing up (vomiting).  You feel confused. Summary  A viral respiratory infection is an illness that affects parts of the body that are used for breathing.  Examples of this illness include a cold, the flu, and respiratory syncytial virus (RSV) infection.  The infection can cause a runny nose, cough, sneezing, sore throat, and fever.  Follow what your doctor tells you about taking medicines, drinking lots of fluid, washing your hands, resting at home, and avoiding people who are sick. This information is not intended to replace advice given to you by your health care provider. Make sure you discuss any questions you have with your health care provider. Document Revised: 09/07/2018 Document Reviewed: 10/10/2017 Elsevier Patient Education  2021 [employer].

## 2020-11-13 ENCOUNTER — Other Ambulatory Visit: Payer: Self-pay

## 2020-11-13 ENCOUNTER — Ambulatory Visit (INDEPENDENT_AMBULATORY_CARE_PROVIDER_SITE_OTHER): Payer: Federal, State, Local not specified - PPO | Admitting: Licensed Clinical Social Worker

## 2020-11-13 DIAGNOSIS — F1994 Other psychoactive substance use, unspecified with psychoactive substance-induced mood disorder: Secondary | ICD-10-CM | POA: Diagnosis not present

## 2020-11-13 DIAGNOSIS — F1010 Alcohol abuse, uncomplicated: Secondary | ICD-10-CM | POA: Diagnosis not present

## 2020-11-13 NOTE — Progress Notes (Signed)
Virtual Visit via Telephone Note  I connected with Dennis Moreno on 11/13/20 at  8:00 AM EST by telephone and verified that I am speaking with the correct person using two identifiers.  Location: Patient: home Provider: office   I discussed the limitations, risks, security and privacy concerns of performing an evaluation and management service by telephone and the availability of in person appointments. I also discussed with the patient that there may be a patient responsible charge related to this service. The patient expressed understanding and agreed to proceed.   I discussed the assessment and treatment plan with the patient. The patient was provided an opportunity to ask questions and all were answered. The patient agreed with the plan and demonstrated an understanding of the instructions.   The patient was advised to call back or seek an in-person evaluation if the symptoms worsen or if the condition fails to improve as anticipated.  I provided 30 minutes of non-face-to-face time during this encounter.   [name], LCSW    THERAPIST PROGRESS NOTE  Session Time: 8:15am  Participation Level: Active  Behavioral Response: NAAlertEuthymic  Type of Therapy: Individual Therapy  Treatment Goals addressed: Anxiety, Coping and Diagnosis: alcohol use disorder  Interventions: CBT, Motivational Interviewing and Supportive  Summary: Dennis Moreno is a 35 y.o. male who presents with alcohol and marijuana abuse. Client reported maintained sobriety since assessment. Client stated he was unable to present in person due to being ill. 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   Suicidal/Homicidal: Nowithout intent/plan  Therapist Response: Clinician checked in with client, assessing for SI/HI/psychosis and overall level of functioning. Clinician  processed with client change in relationship dynamics between client and friends based on boundary setting and sobriety. Clinician validated client feelings related to loss of relationships and frustration with others continued lack of change in unhealthy behaviors. Clinician explained the skill of Radical Acceptance in response to things out of our control.  Plan: Return again in 3 weeks.  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

## 2020-11-19 ENCOUNTER — Encounter: Payer: Self-pay | Admitting: Family Medicine

## 2020-11-19 ENCOUNTER — Telehealth: Payer: Federal, State, Local not specified - PPO | Admitting: Family Medicine

## 2020-11-19 DIAGNOSIS — X503XXA Overexertion from repetitive movements, initial encounter: Secondary | ICD-10-CM

## 2020-11-19 DIAGNOSIS — M791 Myalgia, unspecified site: Secondary | ICD-10-CM

## 2020-11-19 MED ORDER — IBUPROFEN 600 MG PO TABS: 600.0000 mg | ORAL_TABLET | Freq: Three times a day (TID) | ORAL | 0 refills | Status: DC | PRN

## 2020-11-19 NOTE — Patient Instructions (Signed)
RICE Therapy for Routine Care of Injuries Many injuries can be cared for with rest, ice, compression, and elevation (RICE therapy). This includes:  Resting the injured body part.  Putting ice on the injury.  Putting pressure (compression) on the injury.  Raising the injured part (elevation). Using RICE therapy can help to lessen pain and swelling. Supplies needed:  Ice.  Plastic bag.  Towel.  Elastic bandage.  Pillow or pillows to raise your injured body part. How to care for your injury with RICE therapy Rest Try to rest the injured part of your body. You can go back to your normal activities when your doctor says it is okay to do them and when you can do them without pain. If you rest the injury too much, it may not heal as well. Some injuries heal better with early movement instead of resting for too long. Ask your doctor if you should do exercises to help your injury get better. Ice  If told, put ice on the injured area. To do this: ? Put ice in a plastic bag. ? Place a towel between your skin and the bag. ? Leave the ice on for 20 minutes, 2-3 times a day. ? Take off the ice if your skin turns bright red. This is very important. If you cannot feel pain, heat, or cold, you have a greater risk of damage to the area.  Do not put ice on your bare skin. Use ice for as many days as your doctor tells you to use it.   Compression Put pressure on the injured area. This can be done with an elastic bandage. If this type of bandage has been put on your injury:  Follow instructions on the package the bandage came in about how to use it.  Do not wrap the bandage too tightly. ? Wrap the bandage more loosely if part of your body beyond the bandage is blue, swollen, cold, painful, or loses feeling.  Take off the bandage and put it on again every 3-4 hours or as told by your doctor.  See your doctor if the bandage seems to make your problems worse.   Elevation Raise the injured area  above the level of your heart while you are sitting or lying down. Follow these instructions at home:  If your symptoms get worse or last a long time, make a follow-up appointment with your doctor. You may need to have imaging tests, such as X-rays or an MRI.  If you have imaging tests, ask how to get your results when they are ready.  Return to your normal activities when your doctor says that it is safe.  Keep all follow-up visits. Contact a doctor if:  You keep having pain and swelling.  Your symptoms get worse. Get help right away if:  You have sudden, very bad pain at your injury or lower than your injury.  You have redness or more swelling around your injury.  You have tingling or numbness at your injury or lower than your injury, and it does not go away when you take off the bandage. Summary  Many injuries can be cared for using rest, ice, compression, and elevation (RICE therapy).  You can go back to your normal activities when your doctor says it is okay and when you can do them without pain.  Put ice on the injured area as told by your doctor.  Get help if your symptoms get worse or if you keep having pain and swelling.   This information is not intended to replace advice given to you by your health care provider. Make sure you discuss any questions you have with your health care provider. Document Revised: 06/19/2020 Document Reviewed: 06/19/2020 Elsevier Patient Education  2021 Elsevier Inc.    Overtraining in Athletes Overtraining is when an athlete trains too hard or for too long. Overtraining can cause a variety of problems. It can hurt athletic performance, lead to injury, and cause physical and mental symptoms. Overtraining is also called burnout. What are the causes? This condition is caused by working too hard and too long at a sport or activity. It happens when the body does not have enough time to recover. This condition can happen if:  You repeat the  same motions every day, with no variety in activity.  You practice the same sport for more than 5 days a week with no break during those 5 days.  You practice the same sport for more than 10 months a year with no break during those 10 months. What increases the risk? This condition is more likely to develop in:  Adolescents whose bodies are still developing and growing.  Athletes who specialize in one sport.  Athletes who do not take adequate breaks between days of practice or between seasons of competition. What are the signs or symptoms? Physical symptoms of this condition include:  Joint and muscle pain.  Fast heart rate.  Loss of appetite.  Getting sick more often than before.  Tiredness (fatigue). Mental symptoms of this condition include:  Trouble concentrating.  Lack of usual interest in a sport.  Difficulty completing usual routines and practices.  Changes in personality, such as being irritable or moody. How is this diagnosed? This condition is diagnosed based on:  Your symptoms and history of physical activity.  A physical exam. Tests may be done to rule out other causes of your symptoms.   How is this treated? This condition is treated by making changes to your usual routine. Changes may include:  Resting regularly.  Taking a break from your training.  Adding more variety into your workouts. This will also help prevent repetitive use injuries.  Reducing your training and competition schedule. Follow these instructions at home:  Rest as told by your health care provider.  Make changes to your usual routine as recommended by your health care provider.  Make sure you are eating a well-balanced diet and staying hydrated. How is this prevented?  Focus on variety and fun in athletic training, rather than on repetition and intensity.  Take breaks between practices and seasons as needed.  Do not increase repetitions, distance, or other training goals  by more than 10 percent each week.  Do not push yourself too hard. Contact a health care provider if:  You have lasting joint and muscle pain.  Your resting heart rate is faster than normal.  You are getting sick more often than before.  You lose your appetite.  You feel sad.  You have trouble concentrating or doing daily tasks. Summary  Overtraining is when an athlete trains too hard or for too long.  Overtraining can hurt athletic performance, lead to injury, and cause physical and mental symptoms.  This condition is treated by making changes to your usual routine.  Rest as told by your health care provider. This information is not intended to replace advice given to you by your health care provider. Make sure you discuss any questions you have with your health care provider. Document  Revised: 07/26/2018 Document Reviewed: 07/27/2018 Elsevier Patient Education  2021 [employer].

## 2020-11-19 NOTE — Progress Notes (Signed)
[name], Dennis Moreno are scheduled for a virtual visit with your provider today.    Just as we do with appointments in the office, we must obtain your consent to participate.  Your consent will be active for this visit and any virtual visit you may have with one of our providers in the next 365 days.    If you have a MyChart account, I can also send a copy of this consent to you electronically.  All virtual visits are billed to your insurance company just like a traditional visit in the office.  As this is a virtual visit, video technology does not allow for your provider to perform a traditional examination.  This may limit your provider's ability to fully assess your condition.  If your provider identifies any concerns that need to be evaluated in person or the need to arrange testing such as labs, EKG, etc, we will make arrangements to do so.    Although advances in technology are sophisticated, we cannot ensure that it will always work on either your end or our end.  If the connection with a video visit is poor, we may have to switch to a telephone visit.  With either a video or telephone visit, we are not always able to ensure that we have a secure connection.   I need to obtain your verbal consent now.   Are you willing to proceed with your visit today?   Dennis Moreno has provided verbal consent on 11/19/2020 for a virtual visit (video or telephone).   Dennis Finner, NP 11/19/2020  10:07 AM   Date:  11/19/2020   ID:  Dennis Moreno, DOB 1986-05-26, MRN [id number]  Patient Location: Home Provider Location: Home Office   Participants: Patient and Provider for Visit and Wrap up  Method of visit: Video  Location of Patient: Home Location of Provider: Home Office Consent was obtain for visit over the video. Services rendered by provider: Visit was performed via video  A video enabled telemedicine application was used and I verified that I am speaking with the correct person using two  identifiers.  PCP:  Dennis Motto, PA   Chief Complaint:  Leg pain   History of Present Illness:    Dennis Moreno is a 35 y.o. male with history as stated below. Presents video telehealth for an acute care secondary to getting back into the gym and working out.  He has had a previous back and spine injury that he had been out working out and is now getting back into it.  He reports that he was training himself the day before yesterday today is the second day after working out his legs from hips, quads, hamstrings and glutes.  In conversation he reports that he is unable to separate his legs well very painful with movement.  Having difficulty walking.  Reports that over-the-counter medication like ibuprofen and Tylenol seem to ease it but it comes back.  He denies having any cauda equina-like syndrome symptoms in conversation.  No other red flags noted in conversation.  Reports urinating well eating and drinking and staying hydrated.  Past Medical, Surgical, Social History, Allergies, and Medications have been Reviewed.  Past Medical History:  Diagnosis Date  . Depression     No outpatient medications have been marked as taking for the 11/19/20 encounter (Appointment) with Dennis Finner, NP.     Allergies:   Hydrocodone   ROS See HPI for history of present illness.  Physical Exam  Constitutional:      Appearance: Normal appearance.  HENT:     Head: Normocephalic.     Right Ear: External ear normal.     Left Ear: External ear normal.     Nose: Nose normal.  Eyes:     Conjunctiva/sclera: Conjunctivae normal.  Musculoskeletal:        General: Normal range of motion.     Cervical back: Normal range of motion.  Neurological:     Mental Status: He is alert and oriented to person, place, and time.  Psychiatric:        Mood and Affect: Mood normal.        Behavior: Behavior normal.               A&P  1. Pain in the muscles -Most likely related to overuse of  muscles -Diff rhabdo, does not appear to have any signs at this time. -Reviewed red flags with him and when to go to the emergency room patient verbalized understanding. -Due to difficulty with movement and walking I have provided a work note -And 600 mg ibuprofen to use as needed.  He is encouraged to stay hydrated push lots of fluids, use Epson salts, RICE measures reviewed.  - ibuprofen (ADVIL) 600 MG tablet; Take 1 tablet (600 mg total) by mouth every 8 (eight) hours as needed for up to 5 days for moderate pain.  Dispense: 15 tablet; Refill: 0  2. Overuse injury -Most likely related to overuse of muscles -Diff rhabdo, does not appear to have any signs at this time. -Reviewed red flags with him and when to go to the emergency room patient verbalized understanding. -Due to difficulty with movement and walking I have provided a work note -And 600 mg ibuprofen to use as needed.  He is encouraged to stay hydrated push lots of fluids, use Epson salts, RICE measures reviewed.   - ibuprofen (ADVIL) 600 MG tablet; Take 1 tablet (600 mg total) by mouth every 8 (eight) hours as needed for up to 5 days for moderate pain.  Dispense: 15 tablet; Refill: 0   Time:   Today, I have spent 15 minutes with the patient with telehealth technology discussing the above problems, reviewing the chart, previous notes, medications and orders.    Tests Ordered: No orders of the defined types were placed in this encounter.   Medication Changes: No orders of the defined types were placed in this encounter.    Disposition:  Follow up w Ed or PCP Signed, Dennis Finner, NP  11/19/2020 10:07 AM

## 2020-11-20 ENCOUNTER — Other Ambulatory Visit: Payer: Self-pay

## 2020-11-20 ENCOUNTER — Emergency Department (HOSPITAL_BASED_OUTPATIENT_CLINIC_OR_DEPARTMENT_OTHER)
Admission: EM | Admit: 2020-11-20 | Discharge: 2020-11-20 | Discharge status: Against Medical Advice | Payer: Federal, State, Local not specified - PPO | Source: Home / Self Care | Attending: Emergency Medicine | Admitting: Emergency Medicine

## 2020-11-20 ENCOUNTER — Inpatient Hospital Stay (HOSPITAL_COMMUNITY)
Admission: EM | Admit: 2020-11-20 | Discharge: 2020-11-26 | DRG: 558 | Disposition: A | Discharge status: Home / Self Care | Payer: Federal, State, Local not specified - PPO | Attending: Family Medicine | Admitting: Family Medicine

## 2020-11-20 ENCOUNTER — Encounter (HOSPITAL_COMMUNITY): Payer: Self-pay | Admitting: Emergency Medicine

## 2020-11-20 ENCOUNTER — Encounter (HOSPITAL_BASED_OUTPATIENT_CLINIC_OR_DEPARTMENT_OTHER): Payer: Self-pay

## 2020-11-20 DIAGNOSIS — F32A Depression, unspecified: Secondary | ICD-10-CM | POA: Diagnosis present

## 2020-11-20 DIAGNOSIS — Z79899 Other long term (current) drug therapy: Secondary | ICD-10-CM | POA: Diagnosis not present

## 2020-11-20 DIAGNOSIS — D6489 Other specified anemias: Secondary | ICD-10-CM | POA: Diagnosis present

## 2020-11-20 DIAGNOSIS — R739 Hyperglycemia, unspecified: Secondary | ICD-10-CM | POA: Diagnosis not present

## 2020-11-20 DIAGNOSIS — M6282 Rhabdomyolysis: Secondary | ICD-10-CM | POA: Insufficient documentation

## 2020-11-20 DIAGNOSIS — R945 Abnormal results of liver function studies: Secondary | ICD-10-CM

## 2020-11-20 DIAGNOSIS — M791 Myalgia, unspecified site: Secondary | ICD-10-CM

## 2020-11-20 DIAGNOSIS — Z87891 Personal history of nicotine dependence: Secondary | ICD-10-CM | POA: Insufficient documentation

## 2020-11-20 DIAGNOSIS — Z20822 Contact with and (suspected) exposure to covid-19: Secondary | ICD-10-CM | POA: Diagnosis present

## 2020-11-20 DIAGNOSIS — R7989 Other specified abnormal findings of blood chemistry: Secondary | ICD-10-CM

## 2020-11-20 LAB — CBC WITH DIFFERENTIAL/PLATELET
Abs Immature Granulocytes: 0.01 10*3/uL (ref 0.00–0.07)
Basophils Absolute: 0 10*3/uL (ref 0.0–0.1)
Basophils Relative: 0 %
Eosinophils Absolute: 0.2 10*3/uL (ref 0.0–0.5)
Eosinophils Relative: 3 %
HCT: 42.8 % (ref 39.0–52.0)
Hemoglobin: 14.7 g/dL (ref 13.0–17.0)
Immature Granulocytes: 0 %
Lymphocytes Relative: 47 %
Lymphs Abs: 2.6 10*3/uL (ref 0.7–4.0)
MCH: 31.5 pg (ref 26.0–34.0)
MCHC: 34.3 g/dL (ref 30.0–36.0)
MCV: 91.6 fL (ref 80.0–100.0)
Monocytes Absolute: 0.5 10*3/uL (ref 0.1–1.0)
Monocytes Relative: 9 %
Neutro Abs: 2.2 10*3/uL (ref 1.7–7.7)
Neutrophils Relative %: 41 %
Platelets: 355 10*3/uL (ref 150–400)
RBC: 4.67 MIL/uL (ref 4.22–5.81)
RDW: 11.9 % (ref 11.5–15.5)
WBC: 5.5 10*3/uL (ref 4.0–10.5)
nRBC: 0 % (ref 0.0–0.2)

## 2020-11-20 LAB — CBC
HCT: 43.8 % (ref 39.0–52.0)
Hemoglobin: 15 g/dL (ref 13.0–17.0)
MCH: 32.4 pg (ref 26.0–34.0)
MCHC: 34.2 g/dL (ref 30.0–36.0)
MCV: 94.6 fL (ref 80.0–100.0)
Platelets: 378 10*3/uL (ref 150–400)
RBC: 4.63 MIL/uL (ref 4.22–5.81)
RDW: 11.9 % (ref 11.5–15.5)
WBC: 6.4 10*3/uL (ref 4.0–10.5)
nRBC: 0 % (ref 0.0–0.2)

## 2020-11-20 LAB — RAPID URINE DRUG SCREEN, HOSP PERFORMED
Amphetamines: NOT DETECTED
Barbiturates: NOT DETECTED
Benzodiazepines: NOT DETECTED
Cocaine: NOT DETECTED
Opiates: NOT DETECTED
Tetrahydrocannabinol: NOT DETECTED

## 2020-11-20 LAB — BASIC METABOLIC PANEL
Anion gap: 9 (ref 5–15)
BUN: 10 mg/dL (ref 6–20)
CO2: 28 mmol/L (ref 22–32)
Calcium: 9.2 mg/dL (ref 8.9–10.3)
Chloride: 101 mmol/L (ref 98–111)
Creatinine, Ser: 0.75 mg/dL (ref 0.61–1.24)
GFR, Estimated: >60 mL/min (ref >60)
Glucose, Bld: 100 mg/dL — ABNORMAL HIGH (ref 70–99)
Potassium: 4.3 mmol/L (ref 3.5–5.1)
Sodium: 138 mmol/L (ref 135–145)

## 2020-11-20 LAB — COMPREHENSIVE METABOLIC PANEL
ALT: 120 U/L — ABNORMAL HIGH (ref 0–44)
AST: 424 U/L — ABNORMAL HIGH (ref 15–41)
Albumin: 4.4 g/dL (ref 3.5–5.0)
Alkaline Phosphatase: 47 U/L (ref 38–126)
Anion gap: 9 (ref 5–15)
BUN: 13 mg/dL (ref 6–20)
CO2: 30 mmol/L (ref 22–32)
Calcium: 9.6 mg/dL (ref 8.9–10.3)
Chloride: 101 mmol/L (ref 98–111)
Creatinine, Ser: 0.83 mg/dL (ref 0.61–1.24)
GFR, Estimated: >60 mL/min (ref >60)
Glucose, Bld: 75 mg/dL (ref 70–99)
Potassium: 4.1 mmol/L (ref 3.5–5.1)
Sodium: 140 mmol/L (ref 135–145)
Total Bilirubin: 0.5 mg/dL (ref 0.3–1.2)
Total Protein: 7.9 g/dL (ref 6.5–8.1)

## 2020-11-20 LAB — URINALYSIS, ROUTINE W REFLEX MICROSCOPIC
Bilirubin Urine: NEGATIVE
Glucose, UA: NEGATIVE mg/dL
Hgb urine dipstick: NEGATIVE
Ketones, ur: NEGATIVE mg/dL
Leukocytes,Ua: NEGATIVE
Nitrite: NEGATIVE
Protein, ur: NEGATIVE mg/dL
Specific Gravity, Urine: 1.015 (ref 1.005–1.030)
pH: 7.5 (ref 5.0–8.0)

## 2020-11-20 LAB — CK
Total CK: 27646 U/L — ABNORMAL HIGH (ref 49–397)
Total CK: 26055 U/L — ABNORMAL HIGH (ref 49–397)

## 2020-11-20 MED ORDER — SODIUM CHLORIDE 0.9 % IV BOLUS
1000.0000 mL | Freq: Once | INTRAVENOUS | Status: AC
  Administered 2020-11-20: 1000 mL via INTRAVENOUS

## 2020-11-20 MED ORDER — LACTATED RINGERS IV BOLUS
1000.0000 mL | Freq: Once | INTRAVENOUS | Status: AC
  Administered 2020-11-20: 1000 mL via INTRAVENOUS

## 2020-11-20 MED ORDER — HEPARIN SODIUM (PORCINE) 5000 UNIT/ML IJ SOLN
5000.0000 [IU] | Freq: Three times a day (TID) | INTRAMUSCULAR | Status: DC
  Administered 2020-11-20 – 2020-11-22 (×5): 5000 [IU] via SUBCUTANEOUS
  Filled 2020-11-20 (×5): qty 1

## 2020-11-20 MED ORDER — SODIUM CHLORIDE 0.9 % IV SOLN: INTRAVENOUS | Status: DC

## 2020-11-20 NOTE — ED Provider Notes (Signed)
[facility]-EMERGENCY DEPT Provider Note   CSN: [id number] Arrival date & time: 11/20/20  1902     History Chief Complaint  Patient presents with  . Abnormal Lab    Dennis Moreno is a 35 y.o. male.  HPI Patient is a 35 year old male seen at [facility] earlier today for bilateral thigh pain.  His symptoms began several days ago.  He began working out Monday and doing heavy weightlifting including leg presses, squats, other leg exercise.  He states that he began having gradual onset of severe bilateral thigh pain.  He states that he is now having difficulty walking.  He denies any other associated symptoms.  No trauma or injuries.  He states pain is worse with walking or standing up.  No change with frequency of urination.  No abdominal pain or confusion.  No other associate symptoms.     Past Medical History:  Diagnosis Date  . Depression     Patient Active Problem List   Diagnosis Date Noted  . Rhabdomyolysis 11/20/2020  . Low back pain 04/18/2020  . Depression, major, single episode, moderate (HCC) 10/10/2017  . Anxiety 10/10/2017    Past Surgical History:  Procedure Laterality Date  . WISDOM TOOTH EXTRACTION Bilateral 2001       Family History  Problem Relation Age of Onset  . Arthritis Mother   . Depression Mother   . Alcohol abuse Father   . Depression Father   . Depression Sister   . Depression Brother   . Alcohol abuse Maternal Grandfather   . Alcohol abuse Paternal Grandfather   . Depression Paternal Grandfather   . Depression Brother     Social History   Tobacco Use  . Smoking status: Former Smoker    Types: Cigarettes  . Smokeless tobacco: Never Used  Vaping Use  . Vaping Use: Never used  Substance Use Topics  . Alcohol use: Not Currently  . Drug use: Not Currently    Types: Marijuana    Home Medications Prior to Admission medications   Medication Sig Start Date End Date Taking? Authorizing  Provider  Acetaminophen (MIDOL PO) Take 1-2 tablets by mouth daily as needed (pain).   Yes [provider]  BEE POLLEN PO Take 5 mLs by mouth daily.   Yes [provider]  Multiple Vitamin (MULTIVITAMIN PO) Take 1 capsule by mouth in the morning and at bedtime. [place], [place], [place] Root   Yes [provider]  Omega-3 Fatty Acids (FISH OIL) 1000 MG CAPS Take 2 capsules by mouth daily.   Yes [provider]  Probiotic Product (PROBIOTIC PO) Take 2 capsules by mouth daily.   Yes [provider]  benzonatate (TESSALON) 100 MG capsule Take 1 capsule (100 mg total) by mouth 3 (three) times daily as needed for cough. 11/12/20   Freddy Finner, NP  fluticasone (FLONASE) 50 MCG/ACT nasal spray Place 2 sprays into both nostrils daily. 11/12/20   Freddy Finner, NP  gabapentin (NEURONTIN) 100 MG capsule Take 2 capsules (200 mg total) by mouth at bedtime. Patient not taking: No sig reported 04/18/20   Judi Saa, DO  ibuprofen (ADVIL) 600 MG tablet Take 1 tablet (600 mg total) by mouth every 8 (eight) hours as needed for up to 5 days for moderate pain. 11/19/20 11/24/20  Freddy Finner, NP  ondansetron (ZOFRAN ODT) 4 MG disintegrating tablet Take 1 tablet (4 mg total) by mouth every 8 (eight) hours as  needed for nausea or vomiting. 10/15/20   Alric Seton, MD  zolpidem (AMBIEN) 5 MG tablet Take 1 tablet (5 mg total) by mouth at bedtime as needed for sleep. Patient not taking: Reported on 11/20/2020 10/17/20   Massie Maroon, FNP    Allergies    Hydrocodone  Review of Systems   Review of Systems  Constitutional: Negative for chills and fever.  HENT: Negative for congestion.   Eyes: Negative for pain.  Respiratory: Negative for cough and shortness of breath.   Cardiovascular: Negative for chest pain and leg swelling.  Gastrointestinal: Negative for abdominal pain and vomiting.  Genitourinary: Negative for dysuria.  Musculoskeletal: Negative for  myalgias.       Bilateral thigh pain  Skin: Negative for rash.  Neurological: Negative for dizziness and headaches.    Physical Exam Updated Vital Signs BP 138/83 (BP Location: Left Arm)   Pulse 76   Temp 98.1 F (36.7 C) (Oral)   Resp 19   Ht 5\' 9"  (1.753 m)   Wt 68.5 kg   SpO2 100%   BMI 22.30 kg/m   Physical Exam Vitals and nursing note reviewed.  Constitutional:      General: He is not in acute distress. HENT:     Head: Normocephalic and atraumatic.     Nose: Nose normal.  Eyes:     General: No scleral icterus. Cardiovascular:     Rate and Rhythm: Normal rate and regular rhythm.     Pulses: Normal pulses.     Heart sounds: Normal heart sounds.     Comments: Extremity pulses are 3+ and symmetric Pulmonary:     Effort: Pulmonary effort is normal. No respiratory distress.     Breath sounds: No wheezing.  Abdominal:     Palpations: Abdomen is soft.     Tenderness: There is no abdominal tenderness.  Musculoskeletal:     Cervical back: Normal range of motion.     Right lower leg: No edema.     Left lower leg: No edema.     Comments: Diffuse tenderness to palpation.  Compartments are soft.  Good distal pulses in bilateral radial and DP, PT pulses  Skin:    General: Skin is warm and dry.     Capillary Refill: Capillary refill takes less than 2 seconds.  Neurological:     Mental Status: He is alert. Mental status is at baseline.  Psychiatric:        Mood and Affect: Mood normal.        Behavior: Behavior normal.     ED Results / Procedures / Treatments   Labs (all labs ordered are listed, but only abnormal results are displayed) Labs Reviewed  SARS CORONAVIRUS 2 (TAT 6-24 HRS)  CK  CBC  COMPREHENSIVE METABOLIC PANEL    EKG None  Radiology No results found.  Procedures .Critical Care Performed by: , PA Authorized by: Gailen Shelter, PA   Critical care provider statement:    Critical care time (minutes):  35   Critical care time  was exclusive of:  Separately billable procedures and treating other patients and teaching time   Critical care was necessary to treat or prevent imminent or life-threatening deterioration of the following conditions:  Metabolic crisis   Critical care was time spent personally by me on the following activities:  Discussions with consultants, evaluation of patient's response to treatment, examination of patient, review of old charts, re-evaluation of patient's condition, pulse oximetry, ordering and  review of radiographic studies, ordering and review of laboratory studies and ordering and performing treatments and interventions   I assumed direction of critical care for this patient from another provider in my specialty: no       Medications Ordered in ED Medications  lactated ringers bolus 1,000 mL (has no administration in time range)  sodium chloride 0.9 % bolus 1,000 mL (has no administration in time range)  0.9 %  sodium chloride infusion (has no administration in time range)    ED Course  I have reviewed the triage vital signs and the nursing notes.  Pertinent labs & imaging results that were available during my care of the patient were reviewed by me and considered in my medical decision making (see chart for details).    MDM Rules/Calculators/A&P                          Patient mated to the hospitalist for nontraumatic rhabdomyolysis with a CK approximately 27,000.  Patient is understanding of plan and agreeable to admission.  IV hydration initiated by myself.  Covid test pending.  Lab work obtained in ER will be followed by admitting physician.  Final Clinical Impression(s) / ED Diagnoses Final diagnoses:  Non-traumatic rhabdomyolysis    Rx / DC Orders ED Discharge Orders    None       Gailen Shelter, [place] 11/20/20 2113    Derwood Kaplan, MD 11/21/20 1313

## 2020-11-20 NOTE — ED Notes (Signed)
NP Lorel Monaco informed that pt was leaving before treatment compete

## 2020-11-20 NOTE — ED Notes (Signed)
Pt left before treatment complete. Had to check on his children.  Pt informed that we were waiting on last 2 labs but he was still unable to stay

## 2020-11-20 NOTE — H&P (Signed)
History and Physical    Dennis Moreno CZY:[id number] DOB: October 30, 1985 DOA: 11/20/2020  PCP: [name], PA  Patient coming from: Home.  Chief Complaint: Lower extremity pain.  HPI: Dennis Moreno is a 35 y.o. male with no significant past medical history who has not been doing any workouts since patient was having chronic back pain started doing workout on Monday that is around 4 days ago following day patient started having some lower extremity discomfort.  This became acutely worse over the last 24 hours and had come to the ER earlier today and was discharged home.  Following which patient CK level came back as 27,000.  Patient was called back to the ER.  ED Course: In the ER on exam patient has difficulty moving his lower extremities because of pain.  Has good sensation and pulses.  Pain is mostly in the proximal lower extremities.  Patient is afebrile.  No signs of any infection.  Covid test pending.  Patient started on fluids admitted for rhabdomyolysis.  Review of Systems: As per HPI, rest all negative.   Past Medical History:  Diagnosis Date  . Depression     Past Surgical History:  Procedure Laterality Date  . WISDOM TOOTH EXTRACTION Bilateral 2001     reports that he has quit smoking. His smoking use included cigarettes. He has never used smokeless tobacco. He reports previous alcohol use. He reports previous drug use. Drug: Marijuana.  Allergies  Allergen Reactions  . Hydrocodone Itching    Family History  Problem Relation Age of Onset  . Arthritis Mother   . Depression Mother   . Alcohol abuse Father   . Depression Father   . Depression Sister   . Depression Brother   . Alcohol abuse Maternal Grandfather   . Alcohol abuse Paternal Grandfather   . Depression Paternal Grandfather   . Depression Brother     Prior to Admission medications   Medication Sig Start Date End Date Taking? Authorizing Provider  Acetaminophen (MIDOL PO) Take 1-2 tablets  by mouth daily as needed (pain).   Yes [provider]  BEE POLLEN PO Take 5 mLs by mouth daily.   Yes [provider]  Multiple Vitamin (MULTIVITAMIN PO) Take 1 capsule by mouth in the morning and at bedtime. [place], [place], [place] Root   Yes [provider]  Omega-3 Fatty Acids (FISH OIL) 1000 MG CAPS Take 2 capsules by mouth daily.   Yes [provider]  Probiotic Product (PROBIOTIC PO) Take 2 capsules by mouth daily.   Yes [provider]  benzonatate (TESSALON) 100 MG capsule Take 1 capsule (100 mg total) by mouth 3 (three) times daily as needed for cough. 11/12/20   Freddy Finner, NP  fluticasone (FLONASE) 50 MCG/ACT nasal spray Place 2 sprays into both nostrils daily. 11/12/20   Freddy Finner, NP  gabapentin (NEURONTIN) 100 MG capsule Take 2 capsules (200 mg total) by mouth at bedtime. Patient not taking: No sig reported 04/18/20   Judi Saa, DO  ibuprofen (ADVIL) 600 MG tablet Take 1 tablet (600 mg total) by mouth every 8 (eight) hours as needed for up to 5 days for moderate pain. 11/19/20 11/24/20  Freddy Finner, NP  ondansetron (ZOFRAN ODT) 4 MG disintegrating tablet Take 1 tablet (4 mg total) by mouth every 8 (eight) hours as needed for nausea or vomiting. 10/15/20   Alric Seton, MD  zolpidem (AMBIEN) 5 MG tablet Take 1 tablet (5  mg total) by mouth at bedtime as needed for sleep. Patient not taking: Reported on 11/20/2020 10/17/20   Massie Maroon, FNP    Physical Exam: Constitutional: Moderately built and nourished. Vitals:   11/20/20 1914  BP: 138/83  Pulse: 76  Resp: 19  Temp: 98.1 F (36.7 C)  TempSrc: Oral  SpO2: 100%  Weight: 68.5 kg  Height: 5\' 9"  (1.753 m)   Eyes: Anicteric no pallor. ENMT: No discharge from the ears eyes nose or mouth. Neck: No mass felt.  No neck rigidity. Respiratory: No rhonchi or crepitations. Cardiovascular: S1-S2 heard. Abdomen: Soft nontender bowel sounds  present. Musculoskeletal: No localized tenderness or any signs of infection or compartment syndrome.  Restricted movements of the lower extremity due to pain. Skin: No erythema. Neurologic: Alert awake oriented to time place and person.  Moves all extremities. Psychiatric: Appears normal.  Normal affect.   Labs on Admission: I have personally reviewed following labs and imaging studies  CBC: Recent Labs  Lab 11/20/20 1232 11/20/20 2115  WBC 5.5 6.4  NEUTROABS 2.2  --   HGB 14.7 15.0  HCT 42.8 43.8  MCV 91.6 94.6  PLT 355 378   Basic Metabolic Panel: Recent Labs  Lab 11/20/20 1232  NA 138  K 4.3  CL 101  CO2 28  GLUCOSE 100*  BUN 10  CREATININE 0.75  CALCIUM 9.2   GFR: Estimated Creatinine Clearance: 127.2 mL/min (by C-G formula based on SCr of 0.75 mg/dL). Liver Function Tests: No results for input(s): AST, ALT, ALKPHOS, BILITOT, PROT, ALBUMIN in the last 168 hours. No results for input(s): LIPASE, AMYLASE in the last 168 hours. No results for input(s): AMMONIA in the last 168 hours. Coagulation Profile: No results for input(s): INR, PROTIME in the last 168 hours. Cardiac Enzymes: Recent Labs  Lab 11/20/20 1232  CKTOTAL 27,646*   BNP (last 3 results) No results for input(s): PROBNP in the last 8760 hours. HbA1C: No results for input(s): HGBA1C in the last 72 hours. CBG: No results for input(s): GLUCAP in the last 168 hours. Lipid Profile: No results for input(s): CHOL, HDL, LDLCALC, TRIG, CHOLHDL, LDLDIRECT in the last 72 hours. Thyroid Function Tests: No results for input(s): TSH, T4TOTAL, FREET4, T3FREE, THYROIDAB in the last 72 hours. Anemia Panel: No results for input(s): VITAMINB12, FOLATE, FERRITIN, TIBC, IRON, RETICCTPCT in the last 72 hours. Urine analysis:    Component Value Date/Time   COLORURINE YELLOW 11/20/2020 1232   APPEARANCEUR CLEAR 11/20/2020 1232   LABSPEC 1.015 11/20/2020 1232   PHURINE 7.5 11/20/2020 1232   GLUCOSEU NEGATIVE  11/20/2020 1232   HGBUR NEGATIVE 11/20/2020 1232   BILIRUBINUR NEGATIVE 11/20/2020 1232   KETONESUR NEGATIVE 11/20/2020 1232   PROTEINUR NEGATIVE 11/20/2020 1232   UROBILINOGEN 0.2 01/24/2011 1745   NITRITE NEGATIVE 11/20/2020 1232   LEUKOCYTESUR NEGATIVE 11/20/2020 1232   Sepsis Labs: @LABRCNTIP (procalcitonin:4,lacticidven:4) )No results found for this or any previous visit (from the past 240 hour(s)).   Radiological Exams on Admission: No results found.   Assessment/Plan Principal Problem:   Rhabdomyolysis    1. Rhabdomyolysis -likely from patient exerting himself during the exercise.  We will continue with aggressive hydration follow metabolic panel and CK levels.  No signs of any compartment syndrome at this time. 2. Elevated LFTs likely from rhabdomyolysis.  Follow LFTs.  Covid test is pending.   DVT prophylaxis: Heparin. Code Status: Full code. Family Communication: Discussed with patient. Disposition Plan: Home. Consults called: None. Admission status: Observation.   01/20/2021  Toniann Fail MD [facility] Pager [phone].  If 7PM-7AM, please contact night-coverage [email or web address] Password [facility]  11/20/2020, 9:49 PM

## 2020-11-20 NOTE — ED Provider Notes (Addendum)
[facility] EMERGENCY DEPARTMENT Provider Note   CSN: [id number] Arrival date & time: 11/20/20  1119     History Chief Complaint  Patient presents with  . Leg Problem    Dennis Moreno is a 35 y.o. male.  35 year old male presents with complaint of bilateral leg pain and stiffness.  Patient states that he had a pinched nerve in his back 3 to 4 years ago, has not been exercising his low body very much and recently added leg today into his rotation.  Patient reports doing heavy like exercises on Monday, developed stiffness and soreness Tuesday worsening Wednesday, called his doctor and was advised to come to the ER to get checked if not improving with hydration and warm compresses.  Patient reports diffusely sore however more so to the adductors of his thighs.  Reports urine to be light yellow/clear.  Denies vomiting, denies swelling of the legs.  No other complaints or concerns.        Past Medical History:  Diagnosis Date  . Depression     Patient Active Problem List   Diagnosis Date Noted  . Low back pain 04/18/2020  . Depression, major, single episode, moderate (HCC) 10/10/2017  . Anxiety 10/10/2017    Past Surgical History:  Procedure Laterality Date  . WISDOM TOOTH EXTRACTION Bilateral 2001       Family History  Problem Relation Age of Onset  . Arthritis Mother   . Depression Mother   . Alcohol abuse Father   . Depression Father   . Depression Sister   . Depression Brother   . Alcohol abuse Maternal Grandfather   . Alcohol abuse Paternal Grandfather   . Depression Paternal Grandfather   . Depression Brother     Social History   Tobacco Use  . Smoking status: Former Smoker    Types: Cigarettes  . Smokeless tobacco: Never Used  Vaping Use  . Vaping Use: Never used  Substance Use Topics  . Alcohol use: Not Currently  . Drug use: Not Currently    Types: Marijuana    Home Medications Prior to Admission medications   Medication Sig  Start Date End Date Taking? Authorizing Provider  Acetaminophen (MIDOL PO) Take 1-2 tablets by mouth as needed.    [provider]  BEE POLLEN PO Take 5 mLs by mouth daily.    [provider]  benzonatate (TESSALON) 100 MG capsule Take 1 capsule (100 mg total) by mouth 3 (three) times daily as needed for cough. 11/12/20   Freddy Finner, NP  fluticasone (FLONASE) 50 MCG/ACT nasal spray Place 2 sprays into both nostrils daily. 11/12/20   Freddy Finner, NP  gabapentin (NEURONTIN) 100 MG capsule Take 2 capsules (200 mg total) by mouth at bedtime. 04/18/20   Judi Saa, DO  ibuprofen (ADVIL) 600 MG tablet Take 1 tablet (600 mg total) by mouth every 8 (eight) hours as needed for up to 5 days for moderate pain. 11/19/20 11/24/20  Freddy Finner, NP  ondansetron (ZOFRAN ODT) 4 MG disintegrating tablet Take 1 tablet (4 mg total) by mouth every 8 (eight) hours as needed for nausea or vomiting. 10/15/20   Alric Seton, MD  zolpidem (AMBIEN) 5 MG tablet Take 1 tablet (5 mg total) by mouth at bedtime as needed for sleep. 10/17/20   Massie Maroon, FNP    Allergies    Hydrocodone  Review of Systems   Review of Systems  Constitutional: Negative for chills and fever.  Respiratory: Negative for shortness of breath.   Cardiovascular: Negative for chest pain.  Gastrointestinal: Negative for abdominal pain, nausea and vomiting.  Genitourinary: Negative for decreased urine volume, difficulty urinating and dysuria.  Musculoskeletal: Positive for arthralgias and myalgias. Negative for back pain and joint swelling.  Skin: Negative for rash and wound.  Allergic/Immunologic: Negative for immunocompromised state.  Neurological: Negative for weakness and numbness.  All other systems reviewed and are negative.   Physical Exam Updated Vital Signs BP 114/88 (BP Location: Left Arm)   Pulse 70   Temp 98.2 F (36.8 C) (Oral)   Resp 16   Ht 5\' 9"  (1.753 m)   Wt 68.5 kg   SpO2 100%   BMI  22.30 kg/m   Physical Exam Vitals and nursing note reviewed.  Constitutional:      General: He is not in acute distress.    Appearance: He is well-developed. He is not diaphoretic.  HENT:     Head: Normocephalic and atraumatic.  Cardiovascular:     Pulses: Normal pulses.  Pulmonary:     Effort: Pulmonary effort is normal.  Musculoskeletal:        General: Tenderness present. No swelling or deformity.     Right lower leg: No edema.     Left lower leg: No edema.     Comments: Reports generalized tenderness to the lower legs however no specific tenderness on exam, compartments are soft, DP pulses present, sensation intact.  Skin:    General: Skin is warm and dry.     Findings: No erythema or rash.  Neurological:     Mental Status: He is alert and oriented to person, place, and time.     Sensory: No sensory deficit.     Motor: No weakness.     Gait: Gait normal.  Psychiatric:        Behavior: Behavior normal.     ED Results / Procedures / Treatments   Labs (all labs ordered are listed, but only abnormal results are displayed) Labs Reviewed  BASIC METABOLIC PANEL - Abnormal; Notable for the following components:      Result Value   Glucose, Bld 100 (*)    All other components within normal limits  CK - Abnormal; Notable for the following components:   Total CK 27,646 (*)    All other components within normal limits  URINALYSIS, ROUTINE W REFLEX MICROSCOPIC  CBC WITH DIFFERENTIAL/PLATELET    EKG None  Radiology No results found.  Procedures Procedures   Medications Ordered in ED Medications - No data to display  ED Course  I have reviewed the triage vital signs and the nursing notes.  Pertinent labs & imaging results that were available during my care of the patient were reviewed by me and considered in my medical decision making (see chart for details).  Clinical Course as of 11/20/20 1734  Thu Nov 20, 2020  3346 35 year old male with bilateral lower  extremity pain, stiffness after heavy workout 3 days ago. On exam, compartments are soft, sensation intact, DP pulses present. Urine at bedside is clear.  Patient was sent by PCP for evaluation, will check renal function and CK.  CBC unremarkable, urinalysis negative for proteinuria, BMP within normal notes.  CK pending.  [LM]  1404 Ck returns at 27,646. Message left on patient's voice mail to return to the ER for further treatment.  [LM]  1734 Patient returned call, discussed his abnormal CK and advised to return to any ER for consideration for  admission and IV fluids. Patient verbalized understanding.  [LM]    Clinical Course User Index [LM] Alden Hipp   MDM Rules/Calculators/A&P                          Final Clinical Impression(s) / ED Diagnoses Final diagnoses:  Myalgia  Non-traumatic rhabdomyolysis    Rx / DC Orders ED Discharge Orders    None       Alden Hipp 11/20/20 1405    Benjiman Core, MD 11/20/20 1452    Jeannie Fend, PA-C 11/20/20 1735    Benjiman Core, MD 11/21/20 1451

## 2020-11-20 NOTE — ED Notes (Signed)
ED TO INPATIENT HANDOFF REPORT  Name/Age/Gender Dennis Moreno 35 y.o. male  Code Status    Code Status Orders  (From admission, onward)         Start     Ordered   11/20/20 2149  Full code  Continuous        11/20/20 2148        Code Status History    This patient has a current code status but no historical code status.   Advance Care Planning Activity      Home/SNF/Other Home  Chief Complaint Rhabdomyolysis [M62.82]  Level of Care/Admitting Diagnosis ED Disposition    ED Disposition Condition Comment   Admit  Hospital Area: [facility] [[id number]]  Level of Care: Med-Surg [16]  Covid Evaluation: Asymptomatic Screening Protocol (No Symptoms)  Diagnosis: Rhabdomyolysis [728.88.ICD-9-CM]  Admitting Physician: Eduard Clos [[id number]]  Attending Physician: Eduard Clos [id number]       Medical History Past Medical History:  Diagnosis Date  . Depression     Allergies Allergies  Allergen Reactions  . Hydrocodone Itching    IV Location/Drains/Wounds Patient Lines/Drains/Airways Status    Active Line/Drains/Airways    Name Placement date Placement time Site Days   Peripheral IV 11/20/20 Left Antecubital 11/20/20  2119  Antecubital  less than 1          Labs/Imaging Results for orders placed or performed during the hospital encounter of 11/20/20 (from the past 48 hour(s))  CBC     Status: None   Collection Time: 11/20/20  9:15 PM  Result Value Ref Range   WBC 6.4 4.0 - 10.5 K/uL   RBC 4.63 4.22 - 5.81 MIL/uL   Hemoglobin 15.0 13.0 - 17.0 g/dL   HCT 06.3 01.6 - 01.0 %   MCV 94.6 80.0 - 100.0 fL   MCH 32.4 26.0 - 34.0 pg   MCHC 34.2 30.0 - 36.0 g/dL   RDW 93.2 35.5 - 73.2 %   Platelets 378 150 - 400 K/uL   nRBC 0.0 0.0 - 0.2 %    Comment: Performed at [facility], [address] [place], [place] [address]  Comprehensive metabolic panel     Status: Abnormal   Collection Time: 11/20/20  9:15 PM   Result Value Ref Range   Sodium 140 135 - 145 mmol/L   Potassium 4.1 3.5 - 5.1 mmol/L   Chloride 101 98 - 111 mmol/L   CO2 30 22 - 32 mmol/L   Glucose, Bld 75 70 - 99 mg/dL    Comment: Glucose reference range applies only to samples taken after fasting for at least 8 hours.   BUN 13 6 - 20 mg/dL   Creatinine, Ser 2.70 0.61 - 1.24 mg/dL   Calcium 9.6 8.9 - 62.3 mg/dL   Total Protein 7.9 6.5 - 8.1 g/dL   Albumin 4.4 3.5 - 5.0 g/dL   AST 762 (H) 15 - 41 U/L   ALT 120 (H) 0 - 44 U/L   Alkaline Phosphatase 47 38 - 126 U/L   Total Bilirubin 0.5 0.3 - 1.2 mg/dL   GFR, Estimated >83 >15 mL/min    Comment: (NOTE) Calculated using the CKD-EPI Creatinine Equation (2021)    Anion gap 9 5 - 15    Comment: Performed at [facility], [address] [place], [place] [address]   No results found.  Pending Labs [employer] (From admission, onward)  Start     Ordered   11/21/20 0500  HIV Antibody (routine testing w rflx)  (HIV Antibody (Routine testing w reflex) panel)  Tomorrow morning,   R        11/20/20 2148   11/21/20 0500  Comprehensive metabolic panel  Tomorrow morning,   R        11/20/20 2148   11/21/20 0500  CBC  Tomorrow morning,   R        11/20/20 2148   11/21/20 0500  CK  Tomorrow morning,   R        11/20/20 2148   11/20/20 2150  Urine rapid drug screen (hosp performed)  ONCE - STAT,   STAT        11/20/20 2149   11/20/20 2148  CBC  (heparin)  Once,   STAT       Comments: Baseline for heparin therapy IF NOT ALREADY DRAWN.  Notify MD if PLT < 100 K.    11/20/20 2148   11/20/20 2148  Creatinine, serum  (heparin)  Once,   STAT       Comments: Baseline for heparin therapy IF NOT ALREADY DRAWN.    11/20/20 2148   11/20/20 2056  CK  ONCE - STAT,   STAT        11/20/20 2055   11/20/20 2052  SARS CORONAVIRUS 2 (TAT 6-24 HRS) Nasopharyngeal Nasopharyngeal Swab  (Tier 3 - Symptomatic/asymptomatic with Precautions)  Once,   STAT       Question  Answer Comment  Is this test for diagnosis or screening Screening   Symptomatic for COVID-19 as defined by CDC No   Hospitalized for COVID-19 No   Admitted to ICU for COVID-19 No   Previously tested for COVID-19 Yes   Resident in a congregate (group) care setting No   Employed in healthcare setting No   Has patient completed COVID vaccination(s) (2 doses of Pfizer/Moderna 1 dose of [employer]) No      11/20/20 2053          Vitals/Pain Today's Vitals   11/20/20 1914 11/20/20 1949  BP: 138/83   Pulse: 76   Resp: 19   Temp: 98.1 F (36.7 C)   TempSrc: Oral   SpO2: 100%   Weight: 151 lb (68.5 kg)   Height: 5\' 9"  (1.753 m)   PainSc:  0-No pain    Isolation Precautions No active isolations  Medications Medications  sodium chloride 0.9 % bolus 1,000 mL (has no administration in time range)  0.9 %  sodium chloride infusion (has no administration in time range)  heparin injection 5,000 Units (has no administration in time range)  lactated ringers bolus 1,000 mL (1,000 mLs Intravenous New Bag/Given 11/20/20 2120)    Mobility walks

## 2020-11-20 NOTE — ED Triage Notes (Signed)
Pt sts being sent from [facility] for abnormal lab value of CK - 27,000.

## 2020-11-20 NOTE — ED Notes (Signed)
Pt given [place] sandwich, soup and juice

## 2020-11-20 NOTE — Plan of Care (Signed)
Plan of care discussed.  Urine sample sent to lab. Expressive of his needs and agrees to report changes in condition.

## 2020-11-20 NOTE — ED Triage Notes (Signed)
Pt c/o bilat LE pain/decreased mobility after "training real hard-like my legs just won't move"-states was seen through virtual visit yesterday-was advised to come to ED today if no better-NAD-slow steady gait

## 2020-11-21 DIAGNOSIS — R945 Abnormal results of liver function studies: Secondary | ICD-10-CM | POA: Diagnosis not present

## 2020-11-21 DIAGNOSIS — Z79899 Other long term (current) drug therapy: Secondary | ICD-10-CM | POA: Diagnosis not present

## 2020-11-21 DIAGNOSIS — M6282 Rhabdomyolysis: Secondary | ICD-10-CM | POA: Diagnosis not present

## 2020-11-21 DIAGNOSIS — R739 Hyperglycemia, unspecified: Secondary | ICD-10-CM | POA: Diagnosis not present

## 2020-11-21 DIAGNOSIS — Z87891 Personal history of nicotine dependence: Secondary | ICD-10-CM | POA: Diagnosis not present

## 2020-11-21 DIAGNOSIS — R7989 Other specified abnormal findings of blood chemistry: Secondary | ICD-10-CM | POA: Diagnosis not present

## 2020-11-21 DIAGNOSIS — Z20822 Contact with and (suspected) exposure to covid-19: Secondary | ICD-10-CM | POA: Diagnosis present

## 2020-11-21 DIAGNOSIS — F32A Depression, unspecified: Secondary | ICD-10-CM | POA: Diagnosis present

## 2020-11-21 DIAGNOSIS — D6489 Other specified anemias: Secondary | ICD-10-CM | POA: Diagnosis present

## 2020-11-21 LAB — SARS CORONAVIRUS 2 (TAT 6-24 HRS): SARS Coronavirus 2: NEGATIVE

## 2020-11-21 LAB — COMPREHENSIVE METABOLIC PANEL
ALT: 102 U/L — ABNORMAL HIGH (ref 0–44)
AST: 348 U/L — ABNORMAL HIGH (ref 15–41)
Albumin: 3.3 g/dL — ABNORMAL LOW (ref 3.5–5.0)
Alkaline Phosphatase: 40 U/L (ref 38–126)
Anion gap: 8 (ref 5–15)
BUN: 11 mg/dL (ref 6–20)
CO2: 27 mmol/L (ref 22–32)
Calcium: 8.5 mg/dL — ABNORMAL LOW (ref 8.9–10.3)
Chloride: 105 mmol/L (ref 98–111)
Creatinine, Ser: 0.74 mg/dL (ref 0.61–1.24)
GFR, Estimated: >60 mL/min (ref >60)
Glucose, Bld: 96 mg/dL (ref 70–99)
Potassium: 4.1 mmol/L (ref 3.5–5.1)
Sodium: 140 mmol/L (ref 135–145)
Total Bilirubin: 0.6 mg/dL (ref 0.3–1.2)
Total Protein: 6.1 g/dL — ABNORMAL LOW (ref 6.5–8.1)

## 2020-11-21 LAB — CBC
HCT: 39.7 % (ref 39.0–52.0)
Hemoglobin: 13.5 g/dL (ref 13.0–17.0)
MCH: 32.1 pg (ref 26.0–34.0)
MCHC: 34 g/dL (ref 30.0–36.0)
MCV: 94.3 fL (ref 80.0–100.0)
Platelets: 310 10*3/uL (ref 150–400)
RBC: 4.21 MIL/uL — ABNORMAL LOW (ref 4.22–5.81)
RDW: 12 % (ref 11.5–15.5)
WBC: 5.7 10*3/uL (ref 4.0–10.5)
nRBC: 0 % (ref 0.0–0.2)

## 2020-11-21 LAB — HIV ANTIBODY (ROUTINE TESTING W REFLEX): HIV Screen 4th Generation wRfx: NONREACTIVE

## 2020-11-21 LAB — CK: Total CK: 23553 U/L — ABNORMAL HIGH (ref 49–397)

## 2020-11-21 MED ORDER — IBUPROFEN 400 MG PO TABS
400.0000 mg | ORAL_TABLET | Freq: Four times a day (QID) | ORAL | Status: DC | PRN
  Administered 2020-11-21 – 2020-11-25 (×10): 400 mg via ORAL
  Filled 2020-11-21 (×10): qty 1

## 2020-11-21 MED ORDER — SODIUM CHLORIDE 0.9 % IV SOLN: INTRAVENOUS | Status: DC

## 2020-11-21 NOTE — Plan of Care (Signed)
Plan of care reviewed and discussed with the patient. 

## 2020-11-21 NOTE — Progress Notes (Signed)
PROGRESS NOTE    Dennis Moreno  PFX:[id number] DOB: Jun 05, 1986 DOA: 11/20/2020 PCP: [name], PA   Brief Narrative:  The patient is a 35 year old African-American male with no past medical significant history who presented with lower extremity pain after he had been doing workouts.  He has not been doing the workouts since he was having chronic back pain but and started working out again on Monday around 4 days ago and started having lower extremity discomfort.  Is acutely worsened over the last 12 hours and came to the ED and was discharged home.  In the ED he had a CK level and it resulted after he left and came back is 27,000 and he was called back to the ED.  In the ED he had difficulty mostly in his lower extremities because of pain but had good sensation and pulses.  Pain was mostly in the proximal lower extremities and has been afebrile.  Covid testing has been negative.  In the ED he was initiated on IV fluids and [facility] was asked to admit this patient for acute rhabdomyolysis.  Assessment & Plan:   Principal Problem:   Rhabdomyolysis  Acute Rhabdomyolysis  -in the setting of working out and physical activity -Presented with a CK of 27,646 and is now improved with IV fluid hydration is now 23,000 553 -Avoid hepatotoxic and nephrotoxic medications -Continue with IV fluid hydration and he was initiated on 2 L of boluses and started on maintenance IV fluid hydration with normal saline at 150 MLS per hour and continued for 36 hours at least reassess but may need to increase the fluid rate -Continue and encourage oral hydration -Repeat CK levels in a.m.  Abnormal LFTs -In the setting of acute rhabdomyolysis -Patient's AST of 424 on admission is trending down 348 -Patient's ALT went from 120 is now 102 -Continue monitor and trend and if LFTs fail to improve or continue to worsen will obtain a right upper quadrant ultrasound as well as an acute hepatitis panel-repeat CMP in  a.m.  Hyperglycemia  -Likely reactive on admission -Patient's blood sugar on admission was 100 -Continue monitor blood sugars carefully and if necessary will place on sensitive NovoLog sliding scale insulin  -Check hemoglobin A1c in the a.m.  Hypocalcemia -Patient's Ca2+ was 8.5 -Corrected for an Albumin of 3.3 was 9.1 and within normal Limits -Continue to Monitor and Repeat CMP in the AM    DVT prophylaxis: Heparin 5,000 units sq q8h Code Status: FULL CODE  Family Communication: No family present at bedside  Disposition Plan: Pending further clinical improvement of his Rhabdomyolysis and LFTs  Status is: Observation  The patient will require care spanning > 2 midnights and should be moved to inpatient because: Unsafe d/c plan, IV treatments appropriate due to intensity of illness or inability to take PO and Inpatient level of care appropriate due to severity of illness  Dispo: The patient is from: Home              Anticipated d/c is to: Home              Patient currently is not medically stable to d/c.   Difficult to place patient No  Consultants:   None  Procedures: None  Antimicrobials:  Anti-infectives (From admission, onward)   None       Subjective: Seen and examined at bedside and he is still complaining of some leg pain and soreness.  No nausea or vomiting.  Felt okay.  Denies any  lightheadedness or dizziness.  No other concerns or close at this time  Objective: Vitals:   11/20/20 2215 11/20/20 2236 11/21/20 0158 11/21/20 0542  BP: 135/80 130/85 122/70 110/75  Pulse: 80 (!) 54 (!) 57 (!) 56  Resp: 16 16 16 16   Temp: 98 F (36.7 C) 97.8 F (36.6 C) (!) 97.5 F (36.4 C) 97.6 F (36.4 C)  TempSrc: Oral Oral Oral Oral  SpO2: 99% 100% 98% 100%  Weight:  69.3 kg    Height:  5\' 9"  (1.753 m)      Intake/Output Summary (Last 24 hours) at 11/21/2020 1029 Last data filed at 11/21/2020 0600 Gross per 24 hour  Intake 2280.8 ml  Output 1550 ml  Net 730.8 ml    Filed Weights   11/20/20 1914 11/20/20 2236  Weight: 68.5 kg 69.3 kg   Examination: Physical Exam:  Constitutional: WN/WD male in NAD and appears calm and comfortable Eyes: Lids and conjunctivae normal, sclerae anicteric  ENMT: External Ears, Nose appear normal. Grossly normal hearing.  Neck: Appears normal, supple, no cervical masses, normal ROM, no appreciable thyromegaly; no JVD Respiratory: Clear to auscultation bilaterally, no wheezing, rales, rhonchi or crackles. Normal respiratory effort and patient is not tachypenic. No accessory muscle use.  Cardiovascular: RRR, no murmurs / rubs / gallops. S1 and S2 auscultated.  Abdomen: Soft, non-tender, non-distended. Bowel sounds positive.  GU: Deferred. Musculoskeletal: No clubbing / cyanosis of digits/nails. No joint deformity upper and lower extremities.  Skin: No rashes, lesions, ulcers. No induration; Warm and dry.  Neurologic: CN 2-12 grossly intact with no focal deficits. Romberg sign and cerebellar reflexes not assessed.  Psychiatric: Normal judgment and insight. Alert and oriented x 3. Normal mood and appropriate affect.   Data Reviewed: I have personally reviewed following labs and imaging studies  CBC: Recent Labs  Lab 11/20/20 1232 11/20/20 2115 11/21/20 0315  WBC 5.5 6.4 5.7  NEUTROABS 2.2  --   --   HGB 14.7 15.0 13.5  HCT 42.8 43.8 39.7  MCV 91.6 94.6 94.3  PLT 355 378 310   Basic Metabolic Panel: Recent Labs  Lab 11/20/20 1232 11/20/20 2115 11/21/20 0315  NA 138 140 140  K 4.3 4.1 4.1  CL 101 101 105  CO2 28 30 27   GLUCOSE 100* 75 96  BUN 10 13 11   CREATININE 0.75 0.83 0.74  CALCIUM 9.2 9.6 8.5*   GFR: Estimated Creatinine Clearance: 128.7 mL/min (by C-G formula based on SCr of 0.74 mg/dL). Liver Function Tests: Recent Labs  Lab 11/20/20 2115 11/21/20 0315  AST 424* 348*  ALT 120* 102*  ALKPHOS 47 40  BILITOT 0.5 0.6  PROT 7.9 6.1*  ALBUMIN 4.4 3.3*   No results for input(s): LIPASE,  AMYLASE in the last 168 hours. No results for input(s): AMMONIA in the last 168 hours. Coagulation Profile: No results for input(s): INR, PROTIME in the last 168 hours. Cardiac Enzymes: Recent Labs  Lab 11/20/20 1232 11/20/20 2115 11/21/20 0315  CKTOTAL 27,646* 26,055* 23,553*   BNP (last 3 results) No results for input(s): PROBNP in the last 8760 hours. HbA1C: No results for input(s): HGBA1C in the last 72 hours. CBG: No results for input(s): GLUCAP in the last 168 hours. Lipid Profile: No results for input(s): CHOL, HDL, LDLCALC, TRIG, CHOLHDL, LDLDIRECT in the last 72 hours. Thyroid Function Tests: No results for input(s): TSH, T4TOTAL, FREET4, T3FREE, THYROIDAB in the last 72 hours. Anemia Panel: No results for input(s): VITAMINB12, FOLATE, FERRITIN, TIBC, IRON, RETICCTPCT  in the last 72 hours. Sepsis Labs: No results for input(s): PROCALCITON, LATICACIDVEN in the last 168 hours.  Recent Results (from the past 240 hour(s))  SARS CORONAVIRUS 2 (TAT 6-24 HRS) Nasopharyngeal Nasopharyngeal Swab     Status: None   Collection Time: 11/20/20  9:19 PM   Specimen: Nasopharyngeal Swab  Result Value Ref Range Status   SARS Coronavirus 2 NEGATIVE NEGATIVE Final    Comment: (NOTE) SARS-CoV-2 target nucleic acids are NOT DETECTED.  The SARS-CoV-2 RNA is generally detectable in upper and lower respiratory specimens during the acute phase of infection. Negative results do not preclude SARS-CoV-2 infection, do not rule out co-infections with other pathogens, and should not be used as the sole basis for treatment or other patient management decisions. Negative results must be combined with clinical observations, patient history, and epidemiological information. The expected result is Negative.  Fact Sheet for Patients: HairSlick.no  Fact Sheet for Healthcare Providers: quierodirigir.com  This test is not yet approved or  cleared by the [place] FDA and  has been authorized for detection and/or diagnosis of SARS-CoV-2 by FDA under an Emergency Use Authorization (EUA). This EUA will remain  in effect (meaning this test can be used) for the duration of the COVID-19 declaration under Se ction 564(b)(1) of the Act, 21 U.S.C. section 360bbb-3(b)(1), unless the authorization is terminated or revoked sooner.  Performed at [facility], [address], [place], [place] [address]     RN Pressure Injury Documentation:     Estimated body mass index is 22.56 kg/m as calculated from the following:   Height as of this encounter: 5\' 9"  (1.753 m).   Weight as of this encounter: 69.3 kg.  Malnutrition Type:   Malnutrition Characteristics:   Nutrition Interventions:     Radiology Studies: No results found.   Scheduled Meds: . heparin  5,000 Units Subcutaneous Q8H   Continuous Infusions: . sodium chloride 150 mL/hr at 11/21/20 0600    LOS: 0 days   01/21/21, DO [facility] PAGER is on AMION  If 7PM-7AM, please contact night-coverage [email or web address]

## 2020-11-22 LAB — COMPREHENSIVE METABOLIC PANEL
ALT: 121 U/L — ABNORMAL HIGH (ref 0–44)
AST: 400 U/L — ABNORMAL HIGH (ref 15–41)
Albumin: 3.3 g/dL — ABNORMAL LOW (ref 3.5–5.0)
Alkaline Phosphatase: 36 U/L — ABNORMAL LOW (ref 38–126)
Anion gap: 5 (ref 5–15)
BUN: 9 mg/dL (ref 6–20)
CO2: 28 mmol/L (ref 22–32)
Calcium: 8.5 mg/dL — ABNORMAL LOW (ref 8.9–10.3)
Chloride: 106 mmol/L (ref 98–111)
Creatinine, Ser: 0.72 mg/dL (ref 0.61–1.24)
GFR, Estimated: >60 mL/min (ref >60)
Glucose, Bld: 93 mg/dL (ref 70–99)
Potassium: 4 mmol/L (ref 3.5–5.1)
Sodium: 139 mmol/L (ref 135–145)
Total Bilirubin: 0.8 mg/dL (ref 0.3–1.2)
Total Protein: 6.1 g/dL — ABNORMAL LOW (ref 6.5–8.1)

## 2020-11-22 LAB — CBC WITH DIFFERENTIAL/PLATELET
Abs Immature Granulocytes: 0.01 10*3/uL (ref 0.00–0.07)
Basophils Absolute: 0 10*3/uL (ref 0.0–0.1)
Basophils Relative: 1 %
Eosinophils Absolute: 0.3 10*3/uL (ref 0.0–0.5)
Eosinophils Relative: 5 %
HCT: 39.8 % (ref 39.0–52.0)
Hemoglobin: 13.2 g/dL (ref 13.0–17.0)
Immature Granulocytes: 0 %
Lymphocytes Relative: 56 %
Lymphs Abs: 3 10*3/uL (ref 0.7–4.0)
MCH: 31.4 pg (ref 26.0–34.0)
MCHC: 33.2 g/dL (ref 30.0–36.0)
MCV: 94.5 fL (ref 80.0–100.0)
Monocytes Absolute: 0.5 10*3/uL (ref 0.1–1.0)
Monocytes Relative: 9 %
Neutro Abs: 1.5 10*3/uL — ABNORMAL LOW (ref 1.7–7.7)
Neutrophils Relative %: 29 %
Platelets: 322 10*3/uL (ref 150–400)
RBC: 4.21 MIL/uL — ABNORMAL LOW (ref 4.22–5.81)
RDW: 11.9 % (ref 11.5–15.5)
WBC: 5.4 10*3/uL (ref 4.0–10.5)
nRBC: 0 % (ref 0.0–0.2)

## 2020-11-22 LAB — HEMOGLOBIN A1C
Hgb A1c MFr Bld: 5.5 % (ref 4.8–5.6)
Mean Plasma Glucose: 111.15 mg/dL

## 2020-11-22 LAB — MAGNESIUM: Magnesium: 2.1 mg/dL (ref 1.7–2.4)

## 2020-11-22 LAB — CK: Total CK: 22192 U/L — ABNORMAL HIGH (ref 49–397)

## 2020-11-22 LAB — PHOSPHORUS: Phosphorus: 4 mg/dL (ref 2.5–4.6)

## 2020-11-22 MED ORDER — ENOXAPARIN SODIUM 40 MG/0.4ML ~~LOC~~ SOLN
40.0000 mg | SUBCUTANEOUS | Status: DC
  Administered 2020-11-22 – 2020-11-25 (×4): 40 mg via SUBCUTANEOUS
  Filled 2020-11-22 (×4): qty 0.4

## 2020-11-22 MED ORDER — SODIUM CHLORIDE 0.9 % IV BOLUS
1000.0000 mL | Freq: Once | INTRAVENOUS | Status: AC
  Administered 2020-11-22: 1000 mL via INTRAVENOUS

## 2020-11-22 NOTE — Plan of Care (Signed)
°  Problem: Education: °Goal: Knowledge of General Education information will improve °Description: Including pain rating scale, medication(s)/side effects and non-pharmacologic comfort measures °Outcome: Progressing °  °Problem: Clinical Measurements: °Goal: Diagnostic test results will improve °Outcome: Progressing °  °Problem: Clinical Measurements: °Goal: Ability to maintain clinical measurements within normal limits will improve °Outcome: Progressing °  °

## 2020-11-22 NOTE — Progress Notes (Addendum)
PROGRESS NOTE    Dennis Moreno  DUK:[id number] DOB: December 07, 1985 DOA: 11/20/2020 PCP: [name], PA   Brief Narrative:  The patient is a 35 year old African-American male with no past medical significant history who presented with lower extremity pain after he had been doing workouts.  He has not been doing the workouts since he was having chronic back pain but and started working out again on Monday around 4 days ago and started having lower extremity discomfort.  Is acutely worsened over the last 12 hours and came to the ED and was discharged home.  In the ED he had a CK level and it resulted after he left and came back is 27,000 and he was called back to the ED.  In the ED he had difficulty mostly in his lower extremities because of pain but had good sensation and pulses.  Pain was mostly in the proximal lower extremities and has been afebrile.  Covid testing has been negative.  In the ED he was initiated on IV fluids and [facility] was asked to admit this patient for acute rhabdomyolysis.  Assessment & Plan:   Principal Problem:   Rhabdomyolysis  Acute Rhabdomyolysis  -in the setting of working out and physical activity -Presented with a CK of 27,646 and is now improved with IV fluid hydration is now 23,553 yesterday and today it is only minimally improved to 22,192 -Avoid hepatotoxic and nephrotoxic medications -Continue with IV fluid hydration and he was initiated on 2 L of boluses and started on maintenance IV fluid hydration with normal saline at 150 MLS per hour; because his CKs are trending down marginally we will increase the fluid rate to 200 MLS per hour and also give a 1 L bolus now and continue to monitor and trend CK levels -Continue and encourage oral hydration -Repeat CK levels in a.m. -patient complains of some muscle soreness but not worse than admission  Abnormal LFTs -In the setting of acute rhabdomyolysis -Patient's AST of 424 on admission and was trending down but  slightly worsened and went to 400 today -Patient's ALT went from 120 and trended down to 102 yesterday but is now back up to 121 -Continue monitor and trend and if LFTs fail to improve or continue to worsen will obtain a right upper quadrant ultrasound as well as an acute hepatitis panel tomorrow  -Repeat CMP in a.m.  Hyperglycemia  -Likely reactive on admission -Patient's blood sugar on admission was 100 and repeat was 3.3 -Continue monitor blood sugars carefully and if necessary will place on sensitive NovoLog sliding scale insulin  -Check hemoglobin A1c and hemoglobin A1c was 5.5  Hypocalcemia -Patient's Ca2+ was 8.5 -Corrected for an Albumin of 3.3 was 9.1 and within normal Limits again today -Continue to Monitor and Repeat CMP in the AM    DVT prophylaxis: Heparin 5,000 units sq q8h and will change to Enoxaparin 40 mg sq q24h Code Status: FULL CODE  Family Communication: No family present at bedside  Disposition Plan: Pending further clinical improvement of his Rhabdomyolysis and LFTs  Status is: Inpatient  Remains inpatient appropriate because:Unsafe d/c plan, IV treatments appropriate due to intensity of illness or inability to take PO and Inpatient level of care appropriate due to severity of illness   Dispo: The patient is from: Home              Anticipated d/c is to: Home              Patient currently is  not medically stable to d/c.   Difficult to place patient No   Consultants:   None  Procedures: None  Antimicrobials:  Anti-infectives (From admission, onward)   None       Subjective: Seen and examined at bedside and he states that he did not have a good night and did not get very much sleep. No nausea or vomiting.  Still has some leg soreness.  No other concerns or complaints this time.  Objective: Vitals:   11/21/20 0542 11/21/20 1310 11/21/20 2111 11/22/20 0526  BP: 110/75 127/79 119/84 130/81  Pulse: (!) 56 64 67 (!) 55  Resp: 16 19 16 18   Temp:  97.6 F (36.4 C) 97.7 F (36.5 C) 98.4 F (36.9 C) 97.8 F (36.6 C)  TempSrc: Oral Oral Oral Oral  SpO2: 100% 100% 100% 100%  Weight:      Height:        Intake/Output Summary (Last 24 hours) at 11/22/2020 1042 Last data filed at 11/22/2020 1000 Gross per 24 hour  Intake 4661.27 ml  Output 1900 ml  Net 2761.27 ml   Filed Weights   11/20/20 1914 11/20/20 2236  Weight: 68.5 kg 69.3 kg   Examination: Physical Exam:  Constitutional: WN/WD male in no acute distress appears calm but does appear little frustrated Eyes: Lids and conjunctivae normal, sclerae anicteric  ENMT: External Ears, Nose appear normal. Grossly normal hearing. Neck: Appears normal, supple, no cervical masses, normal ROM, no appreciable thyromegaly; no JVD Respiratory: Clear to auscultation bilaterally, no wheezing, rales, rhonchi or crackles. Normal respiratory effort and patient is not tachypenic. No accessory muscle use.  Cardiovascular: RRR, no murmurs / rubs / gallops. S1 and S2 auscultated.  Abdomen: Soft, non-tender, non-distended. Bowel sounds positive.  GU: Deferred. Musculoskeletal: No clubbing / cyanosis of digits/nails. No joint deformity upper and lower extremities.  Skin: No rashes, lesions, ulcers on limited skin evaluation. No induration; Warm and dry.  Neurologic: CN 2-12 grossly intact with no focal deficits. Romberg sign and cerebellar reflexes not assessed.  Psychiatric: Normal judgment and insight. Alert and oriented x 3. Normal mood and appropriate affect.   Data Reviewed: I have personally reviewed following labs and imaging studies  CBC: Recent Labs  Lab 11/20/20 1232 11/20/20 2115 11/21/20 0315 11/22/20 0252  WBC 5.5 6.4 5.7 5.4  NEUTROABS 2.2  --   --  1.5*  HGB 14.7 15.0 13.5 13.2  HCT 42.8 43.8 39.7 39.8  MCV 91.6 94.6 94.3 94.5  PLT 355 378 310 322   Basic Metabolic Panel: Recent Labs  Lab 11/20/20 1232 11/20/20 2115 11/21/20 0315 11/22/20 0252  NA 138 140 140 139   K 4.3 4.1 4.1 4.0  CL 101 101 105 106  CO2 28 30 27 28   GLUCOSE 100* 75 96 93  BUN 10 13 11 9   CREATININE 0.75 0.83 0.74 0.72  CALCIUM 9.2 9.6 8.5* 8.5*  MG  --   --   --  2.1  PHOS  --   --   --  4.0   GFR: Estimated Creatinine Clearance: 128.7 mL/min (by C-G formula based on SCr of 0.72 mg/dL). Liver Function Tests: Recent Labs  Lab 11/20/20 2115 11/21/20 0315 11/22/20 0252  AST 424* 348* 400*  ALT 120* 102* 121*  ALKPHOS 47 40 36*  BILITOT 0.5 0.6 0.8  PROT 7.9 6.1* 6.1*  ALBUMIN 4.4 3.3* 3.3*   No results for input(s): LIPASE, AMYLASE in the last 168 hours. No results for input(s): AMMONIA in  the last 168 hours. Coagulation Profile: No results for input(s): INR, PROTIME in the last 168 hours. Cardiac Enzymes: Recent Labs  Lab 11/20/20 1232 11/20/20 2115 11/21/20 0315 11/22/20 0252  CKTOTAL 37,628* 26,055* 23,553* 22,192*   BNP (last 3 results) No results for input(s): PROBNP in the last 8760 hours. HbA1C: Recent Labs    11/22/20 0252  HGBA1C 5.5   CBG: No results for input(s): GLUCAP in the last 168 hours. Lipid Profile: No results for input(s): CHOL, HDL, LDLCALC, TRIG, CHOLHDL, LDLDIRECT in the last 72 hours. Thyroid Function Tests: No results for input(s): TSH, T4TOTAL, FREET4, T3FREE, THYROIDAB in the last 72 hours. Anemia Panel: No results for input(s): VITAMINB12, FOLATE, FERRITIN, TIBC, IRON, RETICCTPCT in the last 72 hours. Sepsis Labs: No results for input(s): PROCALCITON, LATICACIDVEN in the last 168 hours.  Recent Results (from the past 240 hour(s))  SARS CORONAVIRUS 2 (TAT 6-24 HRS) Nasopharyngeal Nasopharyngeal Swab     Status: None   Collection Time: 11/20/20  9:19 PM   Specimen: Nasopharyngeal Swab  Result Value Ref Range Status   SARS Coronavirus 2 NEGATIVE NEGATIVE Final    Comment: (NOTE) SARS-CoV-2 target nucleic acids are NOT DETECTED.  The SARS-CoV-2 RNA is generally detectable in upper and lower respiratory specimens  during the acute phase of infection. Negative results do not preclude SARS-CoV-2 infection, do not rule out co-infections with other pathogens, and should not be used as the sole basis for treatment or other patient management decisions. Negative results must be combined with clinical observations, patient history, and epidemiological information. The expected result is Negative.  Fact Sheet for Patients: HairSlick.no  Fact Sheet for Healthcare Providers: quierodirigir.com  This test is not yet approved or cleared by the [place] FDA and  has been authorized for detection and/or diagnosis of SARS-CoV-2 by FDA under an Emergency Use Authorization (EUA). This EUA will remain  in effect (meaning this test can be used) for the duration of the COVID-19 declaration under Se ction 564(b)(1) of the Act, 21 U.S.C. section 360bbb-3(b)(1), unless the authorization is terminated or revoked sooner.  Performed at [facility], [address], [place], [place] [address]     RN Pressure Injury Documentation:     Estimated body mass index is 22.56 kg/m as calculated from the following:   Height as of this encounter: 5\' 9"  (1.753 m).   Weight as of this encounter: 69.3 kg.  Malnutrition Type:   Malnutrition Characteristics:   Nutrition Interventions:   Radiology Studies: No results found.   Scheduled Meds: . heparin  5,000 Units Subcutaneous Q8H   Continuous Infusions: . sodium chloride 150 mL/hr at 11/22/20 0250    LOS: 1 day   01/22/21, DO [facility] PAGER is on AMION  If 7PM-7AM, please contact night-coverage [email or web address]

## 2020-11-22 NOTE — Discharge Instructions (Signed)
Rhabdomyolysis Rhabdomyolysis is a condition that happens when muscle cells break down and release substances into the blood that can damage the kidneys. This condition happens because of damage to the muscles that move bones (skeletal muscles). When the skeletal muscles are damaged, substances inside the muscle cells go into the blood. One of these substances is a protein called myoglobin. Large amounts of myoglobin can cause kidney damage or kidney failure. Other substances that are released by muscle cells may upset the balance of the minerals (electrolytes) in the blood. This imbalance causes the blood to have too much acid (acidosis). What are the causes? This condition is caused by muscle damage. There are common causes and possible causes. Common causes of the condition Common causes of muscle damage include:  Using the muscles too much.  Getting an injury that crushes or squeezes a muscle too tightly.  Using drugs, mainly cocaine.  Drinking too much alcohol. Possible causes of the condition Other possible causes of this condition include:  Some prescription medicines, such as: ? Medicines that lower cholesterol (statins). ? Amphetamines, which are used to treat attention deficit hyperactivity disorder (ADHD) or to help with weight loss. ? Certain pain medicines (opiates).  Infections.  Muscle diseases that are passed down from parent to child (inherited).  High fever.  Heatstroke.  Dehydration.  Seizures.  Surgery. What increases the risk? The following factors may make you more likely to develop this condition:  A family history of muscle disease.  Taking part in extreme sports, such as running in marathons.  Diabetes.  Being an older adult.  Heavy drug or alcohol use. What are the signs or symptoms? A person can have the symptoms of rhabdomyolysis or complications that come from the symptoms. Symptoms of the disease Symptoms of this condition vary. Some  people have very few symptoms and other people have many symptoms.  Common symptoms include:  Muscle pain and swelling.  Weak muscles.  Dark urine.  Feeling weak and tired. Other symptoms include:  Nausea and vomiting.  A fever.  Pain in the abdomen.  Pain in the joints. Complications from the disease Complications from this condition include:  A heart rhythm that is not normal (arrhythmia).  Seizures.  Not urinating enough because of kidney failure.  Very low blood pressure (hypotension) and shock. Signs of shock include dizziness, blurry vision, and clammy skin.  Bleeding that is hard to stop or control.  Compartment syndrome. This is when there is too much pressure in the space surrounding muscles. How is this diagnosed? This condition is diagnosed based on:  Your symptoms and medical history.  A physical exam.  Tests, including: ? Blood tests. ? Urine tests to check for myoglobin. You may also have other tests to check for causes of muscle damage and to check for complications. How is this treated? This condition may be treated with:  Fluids and medicines given through an IV that is inserted into one of your veins.  Medicines to lower acidosis or to restore the balance of electrolytes in your blood.  Hemodialysis. This treatment uses an artificial kidney machine to filter your blood while you recover. You may have this if other treatments are not helping.   Follow these instructions at home: Activity  Rest at home as told by your health care provider.  Return to your normal activities as told by your health care provider. Ask your health care provider what activities are safe for you.  Do not do activities that take  a lot of effort. Ask your health care provider what level of exercise is safe for you. Alcohol use  Do not drink alcohol if: ? Your health care provider tells you not to drink. ? You are pregnant, may be pregnant, or are planning to  become pregnant.  If you drink alcohol: ? Limit how much you use to:  0-1 drink a day for women.  0-2 drinks a day for men. ? Be aware of how much alcohol is in your drink. In the [place] one drink equals one 12 oz bottle of beer (355 mL), one 5 oz glass of wine (148 mL), or one 1 oz glass of hard liquor (44 mL). General instructions  Take over-the-counter and prescription medicines only as told by your health care provider.  Drink enough fluid to keep your urine pale yellow.  Do not use drugs.  If you are having problems with drug or alcohol use, ask your health care provider for help.  Keep all follow-up visits as told by your health care provider. This is important.   Contact a health care provider if:  You start having symptoms of this condition after treatment. Get help right away if you:  Have a seizure.  Bleed easily or cannot control bleeding.  Cannot urinate.  Have chest pain.  Have trouble breathing. These symptoms may represent a serious problem that is an emergency. Do not wait to see if the symptoms will go away. Get medical help right away. Call your local emergency services (911 in the [place] Do not drive yourself to the hospital.  Summary  Rhabdomyolysis is a condition that happens when muscle cells break down and release substances into the blood that can damage the kidneys.  This condition happens because of damage to the muscles that move bones (skeletal muscle). It can also be caused by too much alcohol and drug use.  Treatment may include fluids, medicines, and helping the kidneys filter blood (hemodialysis).  Contact your health care provider if you start having symptoms of this condition after treatment.  Get help right away if you have a seizure, chest pain, or trouble breathing, or if you bleed easily, cannot control bleeding, or cannot urinate. This information is not intended to replace advice given to you by your health care provider. Make sure  you discuss any questions you have with your health care provider. Document Revised: 06/07/2019 Document Reviewed: 05/30/2019 Elsevier Patient Education  2021 [employer].

## 2020-11-22 NOTE — Plan of Care (Signed)
  Problem: Pain Managment: Goal: General experience of comfort will improve Outcome: Progressing   

## 2020-11-23 ENCOUNTER — Inpatient Hospital Stay (HOSPITAL_COMMUNITY): Payer: Federal, State, Local not specified - PPO

## 2020-11-23 LAB — CBC WITH DIFFERENTIAL/PLATELET
Abs Immature Granulocytes: 0.01 10*3/uL (ref 0.00–0.07)
Basophils Absolute: 0.1 10*3/uL (ref 0.0–0.1)
Basophils Relative: 1 %
Eosinophils Absolute: 0.3 10*3/uL (ref 0.0–0.5)
Eosinophils Relative: 5 %
HCT: 38.7 % — ABNORMAL LOW (ref 39.0–52.0)
Hemoglobin: 13 g/dL (ref 13.0–17.0)
Immature Granulocytes: 0 %
Lymphocytes Relative: 51 %
Lymphs Abs: 3 10*3/uL (ref 0.7–4.0)
MCH: 31.6 pg (ref 26.0–34.0)
MCHC: 33.6 g/dL (ref 30.0–36.0)
MCV: 93.9 fL (ref 80.0–100.0)
Monocytes Absolute: 0.5 10*3/uL (ref 0.1–1.0)
Monocytes Relative: 9 %
Neutro Abs: 1.9 10*3/uL (ref 1.7–7.7)
Neutrophils Relative %: 34 %
Platelets: 324 10*3/uL (ref 150–400)
RBC: 4.12 MIL/uL — ABNORMAL LOW (ref 4.22–5.81)
RDW: 11.9 % (ref 11.5–15.5)
WBC: 5.7 10*3/uL (ref 4.0–10.5)
nRBC: 0 % (ref 0.0–0.2)

## 2020-11-23 LAB — COMPREHENSIVE METABOLIC PANEL
ALT: 124 U/L — ABNORMAL HIGH (ref 0–44)
AST: 335 U/L — ABNORMAL HIGH (ref 15–41)
Albumin: 3.2 g/dL — ABNORMAL LOW (ref 3.5–5.0)
Alkaline Phosphatase: 36 U/L — ABNORMAL LOW (ref 38–126)
Anion gap: 4 — ABNORMAL LOW (ref 5–15)
BUN: 14 mg/dL (ref 6–20)
CO2: 27 mmol/L (ref 22–32)
Calcium: 8.2 mg/dL — ABNORMAL LOW (ref 8.9–10.3)
Chloride: 107 mmol/L (ref 98–111)
Creatinine, Ser: 0.89 mg/dL (ref 0.61–1.24)
GFR, Estimated: >60 mL/min (ref >60)
Glucose, Bld: 97 mg/dL (ref 70–99)
Potassium: 4.4 mmol/L (ref 3.5–5.1)
Sodium: 138 mmol/L (ref 135–145)
Total Bilirubin: 0.5 mg/dL (ref 0.3–1.2)
Total Protein: 5.9 g/dL — ABNORMAL LOW (ref 6.5–8.1)

## 2020-11-23 LAB — HEPATITIS PANEL, ACUTE
HCV Ab: NONREACTIVE
Hep A IgM: NONREACTIVE
Hep B C IgM: NONREACTIVE
Hepatitis B Surface Ag: NONREACTIVE

## 2020-11-23 LAB — CK: Total CK: 15346 U/L — ABNORMAL HIGH (ref 49–397)

## 2020-11-23 LAB — PHOSPHORUS: Phosphorus: 3.6 mg/dL (ref 2.5–4.6)

## 2020-11-23 LAB — MAGNESIUM: Magnesium: 2.1 mg/dL (ref 1.7–2.4)

## 2020-11-23 MED ORDER — SODIUM CHLORIDE 0.9 % IV BOLUS
1000.0000 mL | Freq: Once | INTRAVENOUS | Status: AC
  Administered 2020-11-23: 1000 mL via INTRAVENOUS

## 2020-11-23 NOTE — Progress Notes (Signed)
PROGRESS NOTE    Dennis Moreno  HQI:[id number] DOB: [birth year] DOA: 11/20/2020 PCP: [name], PA   Brief Narrative:  The patient is a 35 year old African-American male with no past medical significant history who presented with lower extremity pain after he had been doing workouts.  He has not been doing the workouts since he was having chronic back pain but and started working out again on Monday around 4 days ago and started having lower extremity discomfort.  Is acutely worsened over the last 12 hours and came to the ED and was discharged home.  In the ED he had a CK level and it resulted after he left and came back is 27,000 and he was called back to the ED.  In the ED he had difficulty mostly in his lower extremities because of pain but had good sensation and pulses.  Pain was mostly in the proximal lower extremities and has been afebrile.  Covid testing has been negative.  In the ED he was initiated on IV fluids and [facility] was asked to admit this patient for acute rhabdomyolysis.  Assessment & Plan:   Principal Problem:   Rhabdomyolysis  Acute Rhabdomyolysis  -in the setting of working out and physical activity -Presented with a CK of 27,646 and is now improved with IV fluid hydration is now 23,553 yesterday and today it is only minimally improved to 22,192 yesterday and today it is 15,346 -Avoid hepatotoxic and nephrotoxic medications -Continue with IV fluid hydration and he was initiated on 2 L of boluses and started on maintenance IV fluid hydration with normal saline at 150 MLS per hour; because his CKs are trending down marginally we will increase the fluid rate to 200 MLS per hour ; he was given a 1 L bolus yesterday and will give another 1 L today and continue to monitor and trend CK levels -Continue and encourage oral hydration -Repeat CK levels in a.m. -patient complains of some muscle soreness but not worse than admission and feels that this is improving  Abnormal LFTs  fluid -In the setting of acute rhabdomyolysis -Patient's AST of 424 on admission and was trending down and today it is 335 -Patient's ALT went from 120 and today it is 124 -Continue monitor and trend and if LFTs and will obtain a right upper quadrant ultrasound and acute hepatitis panel -Repeat CMP in a.m.  Hyperglycemia  -Likely reactive on admission -Patient's blood sugar on admission was 100 and repeat was 9.7 -Continue monitor blood sugars carefully and if necessary will place on sensitive NovoLog sliding scale insulin  -Check hemoglobin A1c and hemoglobin A1c was 5.5  Hypocalcemia -Patient's Ca2+ was 8.5 -Corrected for an Albumin of 3.3 was 9.1 and within normal Limits again today -Continue to Monitor and Repeat CMP in the AM   DVT prophylaxis: Enoxaparin 40 mg sq q24h Code Status: FULL CODE  Family Communication: No family present at bedside  Disposition Plan: Pending further clinical improvement of his Rhabdomyolysis and LFTs  Status is: Inpatient  Remains inpatient appropriate because:Unsafe d/c plan, IV treatments appropriate due to intensity of illness or inability to take PO and Inpatient level of care appropriate due to severity of illness   Dispo: The patient is from: Home              Anticipated d/c is to: Home              Patient currently is not medically stable to d/c.   Difficult to place patient  No   Consultants:   None  Procedures: Right upper quadrant ultrasound  Antimicrobials:  Anti-infectives (From admission, onward)   None       Subjective: Seen and examined at bedside he is doing okay and not before.  No nausea or vomiting.  Include getting a little bit better.  No other concerns or complaints at this time.  Objective: Vitals:   11/21/20 2111 11/22/20 0526 11/22/20 2234 11/23/20 0626  BP: 119/84 130/81 114/62 109/81  Pulse: 67 (!) 55 66 62  Resp: 16 18 14 15   Temp: 98.4 F (36.9 C) 97.8 F (36.6 C) 97.7 F (36.5 C) 97.7 F (36.5 C)   TempSrc: Oral Oral Oral Oral  SpO2: 100% 100% 99% 98%  Weight:      Height:        Intake/Output Summary (Last 24 hours) at 11/23/2020 1019 Last data filed at 11/23/2020 1000 Gross per 24 hour  Intake 5053.27 ml  Output --  Net 5053.27 ml   Filed Weights   11/20/20 1914 11/20/20 2236  Weight: 68.5 kg 69.3 kg   Examination: Physical Exam:  Constitutional: WN/WD male in no acute distress appears calm Eyes: Lids and conjunctivae normal, sclerae anicteric  ENMT: External Ears, Nose appear normal. Grossly normal hearing.  Neck: Appears normal, supple, no cervical masses, normal ROM, no appreciable thyromegaly; no JVD Respiratory: Clear to auscultation bilaterally, no wheezing, rales, rhonchi or crackles. Normal respiratory effort and patient is not tachypenic. No accessory muscle use.  Cardiovascular: RRR, no murmurs / rubs / gallops. S1 and S2 auscultated.  Lower from edema Abdomen: Soft, non-tender, non-distended.  Bowel sounds positive.  GU: Deferred. Musculoskeletal: No clubbing / cyanosis of digits/nails. No joint deformity upper and lower extremities.  Skin: No rashes, lesions, ulcers on limited skin layer. No induration; Warm and dry.  Neurologic: CN 2-12 grossly intact with no focal deficits. Romberg sign and cerebellar reflexes not assessed.  Psychiatric: Normal judgment and insight. Alert and oriented x 3. Normal mood and appropriate affect.   Data Reviewed: I have personally reviewed following labs and imaging studies  CBC: Recent Labs  Lab 11/20/20 1232 11/20/20 2115 11/21/20 0315 11/22/20 0252 11/23/20 0339  WBC 5.5 6.4 5.7 5.4 5.7  NEUTROABS 2.2  --   --  1.5* 1.9  HGB 14.7 15.0 13.5 13.2 13.0  HCT 42.8 43.8 39.7 39.8 38.7*  MCV 91.6 94.6 94.3 94.5 93.9  PLT 355 378 310 322 324   Basic Metabolic Panel: Recent Labs  Lab 11/20/20 1232 11/20/20 2115 11/21/20 0315 11/22/20 0252 11/23/20 0339  NA 138 140 140 139 138  K 4.3 4.1 4.1 4.0 4.4  CL 101 101  105 106 107  CO2 28 30 27 28 27   GLUCOSE 100* 75 96 93 97  BUN 10 13 11 9 14   CREATININE 0.75 0.83 0.74 0.72 0.89  CALCIUM 9.2 9.6 8.5* 8.5* 8.2*  MG  --   --   --  2.1 2.1  PHOS  --   --   --  4.0 3.6   GFR: Estimated Creatinine Clearance: 115.7 mL/min (by C-G formula based on SCr of 0.89 mg/dL). Liver Function Tests: Recent Labs  Lab 11/20/20 2115 11/21/20 0315 11/22/20 0252 11/23/20 0339  AST 424* 348* 400* 335*  ALT 120* 102* 121* 124*  ALKPHOS 47 40 36* 36*  BILITOT 0.5 0.6 0.8 0.5  PROT 7.9 6.1* 6.1* 5.9*  ALBUMIN 4.4 3.3* 3.3* 3.2*   No results for input(s): LIPASE, AMYLASE in  the last 168 hours. No results for input(s): AMMONIA in the last 168 hours. Coagulation Profile: No results for input(s): INR, PROTIME in the last 168 hours. Cardiac Enzymes: Recent Labs  Lab 11/20/20 1232 11/20/20 2115 11/21/20 0315 11/22/20 0252 11/23/20 0339  CKTOTAL 88,280* 26,055* 23,553* 22,192* 15,346*   BNP (last 3 results) No results for input(s): PROBNP in the last 8760 hours. HbA1C: Recent Labs    11/22/20 0252  HGBA1C 5.5   CBG: No results for input(s): GLUCAP in the last 168 hours. Lipid Profile: No results for input(s): CHOL, HDL, LDLCALC, TRIG, CHOLHDL, LDLDIRECT in the last 72 hours. Thyroid Function Tests: No results for input(s): TSH, T4TOTAL, FREET4, T3FREE, THYROIDAB in the last 72 hours. Anemia Panel: No results for input(s): VITAMINB12, FOLATE, FERRITIN, TIBC, IRON, RETICCTPCT in the last 72 hours. Sepsis Labs: No results for input(s): PROCALCITON, LATICACIDVEN in the last 168 hours.  Recent Results (from the past 240 hour(s))  SARS CORONAVIRUS 2 (TAT 6-24 HRS) Nasopharyngeal Nasopharyngeal Swab     Status: None   Collection Time: 11/20/20  9:19 PM   Specimen: Nasopharyngeal Swab  Result Value Ref Range Status   SARS Coronavirus 2 NEGATIVE NEGATIVE Final    Comment: (NOTE) SARS-CoV-2 target nucleic acids are NOT DETECTED.  The SARS-CoV-2 RNA is  generally detectable in upper and lower respiratory specimens during the acute phase of infection. Negative results do not preclude SARS-CoV-2 infection, do not rule out co-infections with other pathogens, and should not be used as the sole basis for treatment or other patient management decisions. Negative results must be combined with clinical observations, patient history, and epidemiological information. The expected result is Negative.  Fact Sheet for Patients: HairSlick.no  Fact Sheet for Healthcare Providers: quierodirigir.com  This test is not yet approved or cleared by the [place] FDA and  has been authorized for detection and/or diagnosis of SARS-CoV-2 by FDA under an Emergency Use Authorization (EUA). This EUA will remain  in effect (meaning this test can be used) for the duration of the COVID-19 declaration under Se ction 564(b)(1) of the Act, 21 U.S.C. section 360bbb-3(b)(1), unless the authorization is terminated or revoked sooner.  Performed at [facility], [address], [place], [place] [address]     RN Pressure Injury Documentation:     Estimated body mass index is 22.56 kg/m as calculated from the following:   Height as of this encounter: 5\' 9"  (1.753 m).   Weight as of this encounter: 69.3 kg.  Malnutrition Type:   Malnutrition Characteristics:   Nutrition Interventions:   Radiology Studies: No results found.   Scheduled Meds: . enoxaparin (LOVENOX) injection  40 mg Subcutaneous Q24H   Continuous Infusions: . sodium chloride 200 mL/hr at 11/22/20 1707    LOS: 2 days   01/22/21, DO [facility] PAGER is on AMION  If 7PM-7AM, please contact night-coverage [email or web address]

## 2020-11-23 NOTE — Plan of Care (Signed)

## 2020-11-23 NOTE — Plan of Care (Signed)
°  Problem: Education: °Goal: Knowledge of General Education information will improve °Description: Including pain rating scale, medication(s)/side effects and non-pharmacologic comfort measures °Outcome: Progressing °  °Problem: Clinical Measurements: °Goal: Diagnostic test results will improve °Outcome: Progressing °  °Problem: Clinical Measurements: °Goal: Ability to maintain clinical measurements within normal limits will improve °Outcome: Progressing °  °

## 2020-11-24 ENCOUNTER — Encounter (HOSPITAL_COMMUNITY): Payer: Self-pay | Admitting: Internal Medicine

## 2020-11-24 LAB — CBC WITH DIFFERENTIAL/PLATELET
Abs Immature Granulocytes: 0.01 10*3/uL (ref 0.00–0.07)
Basophils Absolute: 0 10*3/uL (ref 0.0–0.1)
Basophils Relative: 1 %
Eosinophils Absolute: 0.2 10*3/uL (ref 0.0–0.5)
Eosinophils Relative: 4 %
HCT: 37.4 % — ABNORMAL LOW (ref 39.0–52.0)
Hemoglobin: 12.6 g/dL — ABNORMAL LOW (ref 13.0–17.0)
Immature Granulocytes: 0 %
Lymphocytes Relative: 56 %
Lymphs Abs: 3.1 10*3/uL (ref 0.7–4.0)
MCH: 31.7 pg (ref 26.0–34.0)
MCHC: 33.7 g/dL (ref 30.0–36.0)
MCV: 94.2 fL (ref 80.0–100.0)
Monocytes Absolute: 0.5 10*3/uL (ref 0.1–1.0)
Monocytes Relative: 9 %
Neutro Abs: 1.7 10*3/uL (ref 1.7–7.7)
Neutrophils Relative %: 30 %
Platelets: 311 10*3/uL (ref 150–400)
RBC: 3.97 MIL/uL — ABNORMAL LOW (ref 4.22–5.81)
RDW: 11.9 % (ref 11.5–15.5)
WBC: 5.6 10*3/uL (ref 4.0–10.5)
nRBC: 0 % (ref 0.0–0.2)

## 2020-11-24 LAB — COMPREHENSIVE METABOLIC PANEL
ALT: 126 U/L — ABNORMAL HIGH (ref 0–44)
AST: 231 U/L — ABNORMAL HIGH (ref 15–41)
Albumin: 3.2 g/dL — ABNORMAL LOW (ref 3.5–5.0)
Alkaline Phosphatase: 35 U/L — ABNORMAL LOW (ref 38–126)
Anion gap: 4 — ABNORMAL LOW (ref 5–15)
BUN: 9 mg/dL (ref 6–20)
CO2: 28 mmol/L (ref 22–32)
Calcium: 8.4 mg/dL — ABNORMAL LOW (ref 8.9–10.3)
Chloride: 107 mmol/L (ref 98–111)
Creatinine, Ser: 0.69 mg/dL (ref 0.61–1.24)
GFR, Estimated: >60 mL/min (ref >60)
Glucose, Bld: 101 mg/dL — ABNORMAL HIGH (ref 70–99)
Potassium: 3.8 mmol/L (ref 3.5–5.1)
Sodium: 139 mmol/L (ref 135–145)
Total Bilirubin: 0.4 mg/dL (ref 0.3–1.2)
Total Protein: 5.8 g/dL — ABNORMAL LOW (ref 6.5–8.1)

## 2020-11-24 LAB — CK: Total CK: 7698 U/L — ABNORMAL HIGH (ref 49–397)

## 2020-11-24 LAB — MAGNESIUM: Magnesium: 1.9 mg/dL (ref 1.7–2.4)

## 2020-11-24 LAB — PHOSPHORUS: Phosphorus: 3.7 mg/dL (ref 2.5–4.6)

## 2020-11-24 NOTE — Progress Notes (Signed)
PROGRESS NOTE    Dennis Moreno  YQM:[id number] DOB: 1986/07/14 DOA: 11/20/2020 PCP: [name], PA   Brief Narrative:  The patient is a 35 year old African-American male with no past medical significant history who presented with lower extremity pain after he had been doing workouts.  He has not been doing the workouts since he was having chronic back pain but and started working out again on Monday around 4 days ago and started having lower extremity discomfort.  Is acutely worsened over the last 12 hours and came to the ED and was discharged home.  In the ED he had a CK level and it resulted after he left and came back is 27,000 and he was called back to the ED.  In the ED he had difficulty mostly in his lower extremities because of pain but had good sensation and pulses.  Pain was mostly in the proximal lower extremities and has been afebrile.  Covid testing has been negative.  In the ED he was initiated on IV fluids and [facility] was asked to admit this patient for acute rhabdomyolysis. Currently still getting IVF Rehydration and CK is trending down. Also had Abnormal LFTs in the setting of his Rhabdo which are slowly trending down as well.  Assessment & Plan:   Principal Problem:   Rhabdomyolysis  Acute Rhabdomyolysis  -in the setting of working out and physical activity -Presented with a CK of 27,646 and is now improving with IVF Hydration and CK is now 7,698 -Avoid hepatotoxic and nephrotoxic medications -Continue with IV fluid hydration and he was initiated on 2 L of boluses and started on maintenance IV fluid hydration with normal saline at 150 MLS per hour; because his CKs were trending down marginally we will increase the fluid rate to 200 MLS per hour ; he was given a 1 L bolus the day before yesterday and given another 1 L yesterday and continue to monitor and trend CK levels -Continue and encourage oral hydration -Repeat CK levels in a.m. -patient complains of some muscle  soreness but not worse than admission and feels that this is improving  Abnormal LFTs fluid -In the setting of acute rhabdomyolysis -Patient's AST of 424 on admission and was trending down and today it is 231 -Patient's ALT went from 120 and today it is 126 -Continue monitor and trend and if LFTs and will obtain a right upper quadrant ultrasound and acute hepatitis panel -Acute Hepatitis Panel Negative and RUQ U/S Normal  -Repeat CMP in a.m.  Hyperglycemia  -Likely reactive on admission -Patient's blood sugar on admission was 100 and repeat was 101 this AM  -Continue monitor blood sugars carefully and if necessary will place on sensitive NovoLog sliding scale insulin  -Check hemoglobin A1c and hemoglobin A1c was 5.5  Hypocalcemia -Patient's Ca2+ was 8.4 -Corrected for an Albumin of 3.2 was 9.0 and within normal Limits again today -Continue to Monitor and Repeat CMP in the AM   Normocytic Anemia -Likely Dilutional Drop from all the IVF he has received -Patient's Hgb/Hct went from 15.0/43.8 -> 12.6/37.4 today; He is 14.301 Liters Positive -Check Anemia Panel in the AM -Continue to Monitor for S/Sx of Bleeding; Currently no overt Bleeding noted -Repeat CBC in the AM  DVT prophylaxis: Enoxaparin 40 mg sq q24h Code Status: FULL CODE  Family Communication: No family present at bedside  Disposition Plan: Pending further clinical improvement of his Rhabdomyolysis and LFTs  Status is: Inpatient  Remains inpatient appropriate because:Unsafe d/c plan, IV treatments  appropriate due to intensity of illness or inability to take PO and Inpatient level of care appropriate due to severity of illness   Dispo: The patient is from: Home              Anticipated d/c is to: Home              Patient currently is not medically stable to d/c.   Difficult to place patient No   Consultants:   None  Procedures: Right upper quadrant ultrasound; Noted to be Normal   Antimicrobials:   Anti-infectives (From admission, onward)   None       Subjective: Seen and examined at bedside feels he is doing better and is less sore.  No nausea or vomiting.  Happy that there is right upper quadrant ultrasound was normal.  No other concerns or complaints at this time.  Objective: Vitals:   11/23/20 0626 11/23/20 1320 11/23/20 2049 11/24/20 0522  BP: 109/81 124/78 123/72 114/84  Pulse: 62 (!) 57 (!) 57 64  Resp: 15 16 18 17   Temp: 97.7 F (36.5 C) (!) 97.1 F (36.2 C) 97.7 F (36.5 C) 98 F (36.7 C)  TempSrc: Oral Axillary    SpO2: 98% 100% 100% 100%  Weight:      Height:        Intake/Output Summary (Last 24 hours) at 11/24/2020 0829 Last data filed at 11/24/2020 0600 Gross per 24 hour  Intake 7056.63 ml  Output --  Net 7056.63 ml   Filed Weights   11/20/20 1914 11/20/20 2236  Weight: 68.5 kg 69.3 kg   Examination: Physical Exam:  Constitutional: WN/WD male in NAD appears calm and comfortable Eyes: Lids and conjunctivae normal, sclerae anicteric  ENMT: External Ears, Nose appear normal. Grossly normal hearing.  Neck: Appears normal, supple, no cervical masses, normal ROM, no appreciable thyromegaly; no JVD Respiratory: Diminished to auscultation bilaterally, no wheezing, rales, rhonchi or crackles. Normal respiratory effort and patient is not tachypenic. No accessory muscle use. Unlabored breathing  Cardiovascular: RRR, no murmurs / rubs / gallops. S1 and S2 auscultated. No extremity edema.  Abdomen: Soft, non-tender, non-distended.  Bowel sounds positive.  GU: Deferred. Musculoskeletal: No clubbing / cyanosis of digits/nails. No joint deformity upper and lower extremities.  Skin: No rashes, lesions, ulcers on a limited skin evaluation. No induration; Warm and dry.  Neurologic: CN 2-12 grossly intact with no focal deficits. Romberg sign and cerebellar reflexes not assessed.  Psychiatric: Normal judgment and insight. Alert and oriented x 3. Normal mood and  appropriate affect.    Data Reviewed: I have personally reviewed following labs and imaging studies  CBC: Recent Labs  Lab 11/20/20 1232 11/20/20 2115 11/21/20 0315 11/22/20 0252 11/23/20 0339 11/24/20 0303  WBC 5.5 6.4 5.7 5.4 5.7 5.6  NEUTROABS 2.2  --   --  1.5* 1.9 1.7  HGB 14.7 15.0 13.5 13.2 13.0 12.6*  HCT 42.8 43.8 39.7 39.8 38.7* 37.4*  MCV 91.6 94.6 94.3 94.5 93.9 94.2  PLT 355 378 310 322 324 311   Basic Metabolic Panel: Recent Labs  Lab 11/20/20 2115 11/21/20 0315 11/22/20 0252 11/23/20 0339 11/24/20 0303  NA 140 140 139 138 139  K 4.1 4.1 4.0 4.4 3.8  CL 101 105 106 107 107  CO2 30 27 28 27 28   GLUCOSE 75 96 93 97 101*  BUN 13 11 9 14 9   CREATININE 0.83 0.74 0.72 0.89 0.69  CALCIUM 9.6 8.5* 8.5* 8.2* 8.4*  MG  --   --  2.1 2.1 1.9  PHOS  --   --  4.0 3.6 3.7   GFR: Estimated Creatinine Clearance: 128.7 mL/min (by C-G formula based on SCr of 0.69 mg/dL). Liver Function Tests: Recent Labs  Lab 11/20/20 2115 11/21/20 0315 11/22/20 0252 11/23/20 0339 11/24/20 0303  AST 424* 348* 400* 335* 231*  ALT 120* 102* 121* 124* 126*  ALKPHOS 47 40 36* 36* 35*  BILITOT 0.5 0.6 0.8 0.5 0.4  PROT 7.9 6.1* 6.1* 5.9* 5.8*  ALBUMIN 4.4 3.3* 3.3* 3.2* 3.2*   No results for input(s): LIPASE, AMYLASE in the last 168 hours. No results for input(s): AMMONIA in the last 168 hours. Coagulation Profile: No results for input(s): INR, PROTIME in the last 168 hours. Cardiac Enzymes: Recent Labs  Lab 11/20/20 2115 11/21/20 0315 11/22/20 0252 11/23/20 0339 11/24/20 0303  CKTOTAL 26,055* 23,553* 22,192* 15,346* 7,698*   BNP (last 3 results) No results for input(s): PROBNP in the last 8760 hours. HbA1C: Recent Labs    11/22/20 0252  HGBA1C 5.5   CBG: No results for input(s): GLUCAP in the last 168 hours. Lipid Profile: No results for input(s): CHOL, HDL, LDLCALC, TRIG, CHOLHDL, LDLDIRECT in the last 72 hours. Thyroid Function Tests: No results for  input(s): TSH, T4TOTAL, FREET4, T3FREE, THYROIDAB in the last 72 hours. Anemia Panel: No results for input(s): VITAMINB12, FOLATE, FERRITIN, TIBC, IRON, RETICCTPCT in the last 72 hours. Sepsis Labs: No results for input(s): PROCALCITON, LATICACIDVEN in the last 168 hours.  Recent Results (from the past 240 hour(s))  SARS CORONAVIRUS 2 (TAT 6-24 HRS) Nasopharyngeal Nasopharyngeal Swab     Status: None   Collection Time: 11/20/20  9:19 PM   Specimen: Nasopharyngeal Swab  Result Value Ref Range Status   SARS Coronavirus 2 NEGATIVE NEGATIVE Final    Comment: (NOTE) SARS-CoV-2 target nucleic acids are NOT DETECTED.  The SARS-CoV-2 RNA is generally detectable in upper and lower respiratory specimens during the acute phase of infection. Negative results do not preclude SARS-CoV-2 infection, do not rule out co-infections with other pathogens, and should not be used as the sole basis for treatment or other patient management decisions. Negative results must be combined with clinical observations, patient history, and epidemiological information. The expected result is Negative.  Fact Sheet for Patients: [employer]no  Fact Sheet for Healthcare Providers: quierodirigir.com  This test is not yet approved or cleared by the [place] FDA and  has been authorized for detection and/or diagnosis of SARS-CoV-2 by FDA under an Emergency Use Authorization (EUA). This EUA will remain  in effect (meaning this test can be used) for the duration of the COVID-19 declaration under Se ction 564(b)(1) of the Act, 21 U.S.C. section 360bbb-3(b)(1), unless the authorization is terminated or revoked sooner.  Performed at [facility], [address] [place], [place] [address]     RN Pressure Injury Documentation:     Estimated body mass index is 22.56 kg/m as calculated from the following:   Height as of this encounter: 5\' 9"  (1.753 m).    Weight as of this encounter: 69.3 kg.  Malnutrition Type:   Malnutrition Characteristics:   Nutrition Interventions:   Radiology Studies: Abdomen Limited RUQ (LIVER/GB)  Result Date: 11/23/2020 CLINICAL DATA:  Abnormal liver function test. EXAM: ULTRASOUND ABDOMEN LIMITED RIGHT UPPER QUADRANT COMPARISON:  None. FINDINGS: Gallbladder: No gallstones or wall thickening visualized (1.9 mm). No sonographic Murphy sign noted by sonographer. Common bile duct: Diameter: 2.0 mm Liver: No focal lesion identified. Within normal  limits in parenchymal echogenicity. Portal vein is patent on color Doppler imaging with normal direction of blood flow towards the liver. Other: None. IMPRESSION: Normal right upper quadrant ultrasound. Electronically Signed   By: Aram Candelahaddeus  Houston M.D.   On: 11/23/2020 17:27     Scheduled Meds: . enoxaparin (LOVENOX) injection  40 mg Subcutaneous Q24H   Continuous Infusions: . sodium chloride 200 mL/hr at 11/22/20 1707    LOS: 3 days   Merlene Laughtermair Latif Carnell Casamento, DO [facility] PAGER is on AMION  If 7PM-7AM, please contact night-coverage [email or web address]

## 2020-11-25 LAB — COMPREHENSIVE METABOLIC PANEL
ALT: 143 U/L — ABNORMAL HIGH (ref 0–44)
AST: 185 U/L — ABNORMAL HIGH (ref 15–41)
Albumin: 3.4 g/dL — ABNORMAL LOW (ref 3.5–5.0)
Alkaline Phosphatase: 41 U/L (ref 38–126)
Anion gap: 7 (ref 5–15)
BUN: 8 mg/dL (ref 6–20)
CO2: 27 mmol/L (ref 22–32)
Calcium: 8.7 mg/dL — ABNORMAL LOW (ref 8.9–10.3)
Chloride: 106 mmol/L (ref 98–111)
Creatinine, Ser: 0.86 mg/dL (ref 0.61–1.24)
GFR, Estimated: >60 mL/min (ref >60)
Glucose, Bld: 92 mg/dL (ref 70–99)
Potassium: 4.2 mmol/L (ref 3.5–5.1)
Sodium: 140 mmol/L (ref 135–145)
Total Bilirubin: 0.4 mg/dL (ref 0.3–1.2)
Total Protein: 5.9 g/dL — ABNORMAL LOW (ref 6.5–8.1)

## 2020-11-25 LAB — CBC WITH DIFFERENTIAL/PLATELET
Abs Immature Granulocytes: 0.01 10*3/uL (ref 0.00–0.07)
Basophils Absolute: 0.1 10*3/uL (ref 0.0–0.1)
Basophils Relative: 1 %
Eosinophils Absolute: 0.2 10*3/uL (ref 0.0–0.5)
Eosinophils Relative: 4 %
HCT: 38.9 % — ABNORMAL LOW (ref 39.0–52.0)
Hemoglobin: 13.2 g/dL (ref 13.0–17.0)
Immature Granulocytes: 0 %
Lymphocytes Relative: 53 %
Lymphs Abs: 2.6 10*3/uL (ref 0.7–4.0)
MCH: 31.9 pg (ref 26.0–34.0)
MCHC: 33.9 g/dL (ref 30.0–36.0)
MCV: 94 fL (ref 80.0–100.0)
Monocytes Absolute: 0.5 10*3/uL (ref 0.1–1.0)
Monocytes Relative: 10 %
Neutro Abs: 1.5 10*3/uL — ABNORMAL LOW (ref 1.7–7.7)
Neutrophils Relative %: 32 %
Platelets: 323 10*3/uL (ref 150–400)
RBC: 4.14 MIL/uL — ABNORMAL LOW (ref 4.22–5.81)
RDW: 11.9 % (ref 11.5–15.5)
WBC: 4.8 10*3/uL (ref 4.0–10.5)
nRBC: 0 % (ref 0.0–0.2)

## 2020-11-25 LAB — PHOSPHORUS: Phosphorus: 4.3 mg/dL (ref 2.5–4.6)

## 2020-11-25 LAB — MAGNESIUM: Magnesium: 2.1 mg/dL (ref 1.7–2.4)

## 2020-11-25 LAB — CK: Total CK: 4512 U/L — ABNORMAL HIGH (ref 49–397)

## 2020-11-25 MED ORDER — SODIUM CHLORIDE 0.9 % IV BOLUS
1000.0000 mL | Freq: Once | INTRAVENOUS | Status: AC
  Administered 2020-11-25: 1000 mL via INTRAVENOUS

## 2020-11-25 NOTE — Progress Notes (Signed)
PROGRESS NOTE    Dennis Moreno  BMW:[id number] DOB: [birth year] DOA: 11/20/2020 PCP: Jarold MottoWorley, Samantha, PA   Brief Narrative:  The patient is a 35 year old African-American male with no past medical significant history who presented with lower extremity pain after he had been doing workouts.  He has not been doing the workouts since he was having chronic back pain but and started working out again on Monday around 4 days ago and started having lower extremity discomfort.  Is acutely worsened over the last 12 hours and came to the ED and was discharged home.  In the ED he had a CK level and it resulted after he left and came back is 27,000 and he was called back to the ED.  In the ED he had difficulty mostly in his lower extremities because of pain but had good sensation and pulses.  Pain was mostly in the proximal lower extremities and has been afebrile.  Covid testing has been negative.  In the ED he was initiated on IV fluids and [facility] was asked to admit this patient for acute rhabdomyolysis. Currently still getting IVF Rehydration and CK is trending down. Also had Abnormal LFTs in the setting of his Rhabdo which are slowly trending down as well.  Unfortunately yesterday on 11/24/2020 his IV malfunctioned and was removed and he was without IV and without fluid hydration for several hours.  Nursing staff could not place a new IV IV team had to be consulted.  Has a new IV and will give him a 1 L bolus today and continue with hydration at 200 mL/hr and will follow CK as it is still trending down.   Assessment & Plan:   Principal Problem:   Rhabdomyolysis  Acute Rhabdomyolysis  -in the setting of working out and physical activity -Presented with a CK of 27,646 and is now improving with IVF Hydration and CK is now 4,512 -Avoid hepatotoxic and nephrotoxic medications -Continue with IV fluid hydration and he was initiated on 2 L of boluses and started on maintenance IV fluid hydration with normal  saline at 150 MLS per hour; because his CKs were trending down marginally we will increase the fluid rate to 200 MLS per hour ; will give him another 1 L bolus today and continue with hydration; unfortunately his IV yesterday and is without active for several hours.  Fluid hydration -Continue and encourage oral hydration -Repeat CK levels in a.m. -patient complains of some muscle soreness but not worse than admission and feels that this is improving  Abnormal LFTs fluid -In the setting of acute rhabdomyolysis -Patient's AST of 424 on admission and was trending down and today it is 185 -Patient's ALT went from 120 and today it is mildly trending up 143 -Continue monitor and trend and if LFTs and will obtain a right upper quadrant ultrasound and acute hepatitis panel -Acute Hepatitis Panel Negative and RUQ U/S Normal  -Repeat CMP in a.m.  Hyperglycemia  -Likely reactive on admission -Patient's blood sugar on admission was 100 and repeat was 101 this AM  -Continue monitor blood sugars carefully and if necessary will place on sensitive NovoLog sliding scale insulin  -Check hemoglobin A1c and hemoglobin A1c was 5.5  Hypocalcemia -Patient's Ca2+ was 8.7 -Corrected for an Albumin of 3.4 was 9.2 and within normal Limits again today -Continue to Monitor and Repeat CMP in the AM   Normocytic Anemia -Likely Dilutional Drop from all the IVF he has received -Patient's Hgb/Hct went from 15.0/43.8 -> 12.6/37.4 ->  13.2/38.9; He is 18.2691 Liters Positive -Check Anemia Panel in the AM if necessary  -Continue to Monitor for S/Sx of Bleeding; Currently no overt Bleeding noted -Repeat CBC in the AM  DVT prophylaxis: Enoxaparin 40 mg sq q24h Code Status: FULL CODE  Family Communication: No family present at bedside  Disposition Plan: Pending further clinical improvement of his Rhabdomyolysis and LFTs  Status is: Inpatient  Remains inpatient appropriate because:Unsafe d/c plan, IV treatments  appropriate due to intensity of illness or inability to take PO and Inpatient level of care appropriate due to severity of illness   Dispo: The patient is from: Home              Anticipated d/c is to: Home              Patient currently is not medically stable to d/c.   Difficult to place patient No   Consultants:   None  Procedures: Right upper quadrant ultrasound; Noted to be Normal   Antimicrobials:  Anti-infectives (From admission, onward)   None       Subjective: Seen and examined at bedside feels like he is improving but unfortunately lost his IV yesterday and was without it for several hours.  States it started bleeding and and staff tried to reinforce it but then it started leaking.  Then they could not get a new IV site on him and so IV team was consulted.  He denies any chest pain or shortness of breath has no other concerns or complaints at this time.  Objective: Vitals:   11/24/20 0522 11/24/20 1408 11/24/20 2137 11/25/20 0457  BP: 114/84 135/83 (!) 150/75 108/74  Pulse: 64 62 69 60  Resp: 17 16 16 16   Temp: 98 F (36.7 C) 98.5 F (36.9 C) 98 F (36.7 C) 97.6 F (36.4 C)  TempSrc:  Oral Oral Oral  SpO2: 100% 97% 98% 100%  Weight:      Height:        Intake/Output Summary (Last 24 hours) at 11/25/2020 1013 Last data filed at 11/25/2020 0700 Gross per 24 hour  Intake 3728.13 ml  Output --  Net 3728.13 ml   Filed Weights   11/20/20 1914 11/20/20 2236  Weight: 68.5 kg 69.3 kg   Examination: Physical Exam:  Constitutional: WN/WD male in NAD appears calm Eyes: Lids and conjunctivae normal, sclerae anicteric  ENMT: External Ears, Nose appear normal. Grossly normal hearing.  Neck: Appears normal, supple, no cervical masses, normal ROM, no appreciable thyromegaly; no JVD Respiratory: Diminished to auscultation bilaterally, no wheezing, rales, rhonchi or crackles. Normal respiratory effort and patient is not tachypenic. No accessory muscle use. Unlabored  breathing Cardiovascular: RRR, no murmurs / rubs / gallops. S1 and S2 auscultated.  Abdomen: Soft, non-tender, non-distended. Bowel sounds positive.  GU: Deferred. Musculoskeletal: No clubbing / cyanosis of digits/nails. No joint deformity upper and lower extremities.  Skin: No rashes, lesions, ulcers on a limited skin evaluation. No induration; Warm and dry.  Neurologic: CN 2-12 grossly intact with no focal deficits. Romberg sign and cerebellar reflexes not assessed.  Psychiatric: Normal judgment and insight. Alert and oriented x 3. Normal mood and appropriate affect.   Data Reviewed: I have personally reviewed following labs and imaging studies  CBC: Recent Labs  Lab 11/20/20 1232 11/20/20 2115 11/21/20 0315 11/22/20 0252 11/23/20 0339 11/24/20 0303 11/25/20 0303  WBC 5.5   < > 5.7 5.4 5.7 5.6 4.8  NEUTROABS 2.2  --   --  1.5* 1.9  1.7 1.5*  HGB 14.7   < > 13.5 13.2 13.0 12.6* 13.2  HCT 42.8   < > 39.7 39.8 38.7* 37.4* 38.9*  MCV 91.6   < > 94.3 94.5 93.9 94.2 94.0  PLT 355   < > 310 322 324 311 323   < > = values in this interval not displayed.   Basic Metabolic Panel: Recent Labs  Lab 11/21/20 0315 11/22/20 0252 11/23/20 0339 11/24/20 0303 11/25/20 0303  NA 140 139 138 139 140  K 4.1 4.0 4.4 3.8 4.2  CL 105 106 107 107 106  CO2 27 28 27 28 27   GLUCOSE 96 93 97 101* 92  BUN 11 9 14 9 8   CREATININE 0.74 0.72 0.89 0.69 0.86  CALCIUM 8.5* 8.5* 8.2* 8.4* 8.7*  MG  --  2.1 2.1 1.9 2.1  PHOS  --  4.0 3.6 3.7 4.3   GFR: Estimated Creatinine Clearance: 119.8 mL/min (by C-G formula based on SCr of 0.86 mg/dL). Liver Function Tests: Recent Labs  Lab 11/21/20 0315 11/22/20 0252 11/23/20 0339 11/24/20 0303 11/25/20 0303  AST 348* 400* 335* 231* 185*  ALT 102* 121* 124* 126* 143*  ALKPHOS 40 36* 36* 35* 41  BILITOT 0.6 0.8 0.5 0.4 0.4  PROT 6.1* 6.1* 5.9* 5.8* 5.9*  ALBUMIN 3.3* 3.3* 3.2* 3.2* 3.4*   No results for input(s): LIPASE, AMYLASE in the last 168  hours. No results for input(s): AMMONIA in the last 168 hours. Coagulation Profile: No results for input(s): INR, PROTIME in the last 168 hours. Cardiac Enzymes: Recent Labs  Lab 11/21/20 0315 11/22/20 0252 11/23/20 0339 11/24/20 0303 11/25/20 0303  CKTOTAL 23,553* 22,192* 15,346* 7,698* 4,512*   BNP (last 3 results) No results for input(s): PROBNP in the last 8760 hours. HbA1C: No results for input(s): HGBA1C in the last 72 hours. CBG: No results for input(s): GLUCAP in the last 168 hours. Lipid Profile: No results for input(s): CHOL, HDL, LDLCALC, TRIG, CHOLHDL, LDLDIRECT in the last 72 hours. Thyroid Function Tests: No results for input(s): TSH, T4TOTAL, FREET4, T3FREE, THYROIDAB in the last 72 hours. Anemia Panel: No results for input(s): VITAMINB12, FOLATE, FERRITIN, TIBC, IRON, RETICCTPCT in the last 72 hours. Sepsis Labs: No results for input(s): PROCALCITON, LATICACIDVEN in the last 168 hours.  Recent Results (from the past 240 hour(s))  SARS CORONAVIRUS 2 (TAT 6-24 HRS) Nasopharyngeal Nasopharyngeal Swab     Status: None   Collection Time: 11/20/20  9:19 PM   Specimen: Nasopharyngeal Swab  Result Value Ref Range Status   SARS Coronavirus 2 NEGATIVE NEGATIVE Final    Comment: (NOTE) SARS-CoV-2 target nucleic acids are NOT DETECTED.  The SARS-CoV-2 RNA is generally detectable in upper and lower respiratory specimens during the acute phase of infection. Negative results do not preclude SARS-CoV-2 infection, do not rule out co-infections with other pathogens, and should not be used as the sole basis for treatment or other patient management decisions. Negative results must be combined with clinical observations, patient history, and epidemiological information. The expected result is Negative.  Fact Sheet for Patients: 11/27/20  Fact Sheet for Healthcare Providers: 01/20/21  This test is not  yet approved or cleared by the [employer] FDA and  has been authorized for detection and/or diagnosis of SARS-CoV-2 by FDA under an Emergency Use Authorization (EUA). This EUA will remain  in effect (meaning this test can be used) for the duration of the COVID-19 declaration under Se ction 564(b)(1) of the Act, 21 U.S.C. section  360bbb-3(b)(1), unless the authorization is terminated or revoked sooner.  Performed at [facility], [address] [place], [place] [address]     RN Pressure Injury Documentation:     Estimated body mass index is 22.56 kg/m as calculated from the following:   Height as of this encounter: 5\' 9"  (1.753 m).   Weight as of this encounter: 69.3 kg.  Malnutrition Type:   Malnutrition Characteristics:   Nutrition Interventions:   Radiology Studies: Abdomen Limited RUQ (LIVER/GB)  Result Date: 11/23/2020 CLINICAL DATA:  Abnormal liver function test. EXAM: ULTRASOUND ABDOMEN LIMITED RIGHT UPPER QUADRANT COMPARISON:  None. FINDINGS: Gallbladder: No gallstones or wall thickening visualized (1.9 mm). No sonographic Murphy sign noted by sonographer. Common bile duct: Diameter: 2.0 mm Liver: No focal lesion identified. Within normal limits in parenchymal echogenicity. Portal vein is patent on color Doppler imaging with normal direction of blood flow towards the liver. Other: None. IMPRESSION: Normal right upper quadrant ultrasound. Electronically Signed   By: 11/25/2020 M.D.   On: 11/23/2020 17:27     Scheduled Meds: . enoxaparin (LOVENOX) injection  40 mg Subcutaneous Q24H   Continuous Infusions: . sodium chloride 200 mL/hr at 11/25/20 0523    LOS: 4 days   11/27/20, DO [facility] PAGER is on AMION  If 7PM-7AM, please contact night-coverage [email or web address]

## 2020-11-26 LAB — CBC WITH DIFFERENTIAL/PLATELET
Abs Immature Granulocytes: 0.01 10*3/uL (ref 0.00–0.07)
Basophils Absolute: 0 10*3/uL (ref 0.0–0.1)
Basophils Relative: 1 %
Eosinophils Absolute: 0.2 10*3/uL (ref 0.0–0.5)
Eosinophils Relative: 4 %
HCT: 36.8 % — ABNORMAL LOW (ref 39.0–52.0)
Hemoglobin: 12.4 g/dL — ABNORMAL LOW (ref 13.0–17.0)
Immature Granulocytes: 0 %
Lymphocytes Relative: 53 %
Lymphs Abs: 2.9 10*3/uL (ref 0.7–4.0)
MCH: 31.6 pg (ref 26.0–34.0)
MCHC: 33.7 g/dL (ref 30.0–36.0)
MCV: 93.9 fL (ref 80.0–100.0)
Monocytes Absolute: 0.6 10*3/uL (ref 0.1–1.0)
Monocytes Relative: 12 %
Neutro Abs: 1.6 10*3/uL — ABNORMAL LOW (ref 1.7–7.7)
Neutrophils Relative %: 30 %
Platelets: 330 10*3/uL (ref 150–400)
RBC: 3.92 MIL/uL — ABNORMAL LOW (ref 4.22–5.81)
RDW: 11.9 % (ref 11.5–15.5)
WBC: 5.4 10*3/uL (ref 4.0–10.5)
nRBC: 0 % (ref 0.0–0.2)

## 2020-11-26 LAB — COMPREHENSIVE METABOLIC PANEL
ALT: 147 U/L — ABNORMAL HIGH (ref 0–44)
AST: 142 U/L — ABNORMAL HIGH (ref 15–41)
Albumin: 3.2 g/dL — ABNORMAL LOW (ref 3.5–5.0)
Alkaline Phosphatase: 42 U/L (ref 38–126)
Anion gap: 6 (ref 5–15)
BUN: 9 mg/dL (ref 6–20)
CO2: 29 mmol/L (ref 22–32)
Calcium: 8.9 mg/dL (ref 8.9–10.3)
Chloride: 108 mmol/L (ref 98–111)
Creatinine, Ser: 0.87 mg/dL (ref 0.61–1.24)
GFR, Estimated: >60 mL/min (ref >60)
Glucose, Bld: 100 mg/dL — ABNORMAL HIGH (ref 70–99)
Potassium: 3.9 mmol/L (ref 3.5–5.1)
Sodium: 143 mmol/L (ref 135–145)
Total Bilirubin: 0.6 mg/dL (ref 0.3–1.2)
Total Protein: 5.7 g/dL — ABNORMAL LOW (ref 6.5–8.1)

## 2020-11-26 LAB — CK: Total CK: 2840 U/L — ABNORMAL HIGH (ref 49–397)

## 2020-11-26 LAB — MAGNESIUM: Magnesium: 2.1 mg/dL (ref 1.7–2.4)

## 2020-11-26 LAB — PHOSPHORUS: Phosphorus: 4.8 mg/dL — ABNORMAL HIGH (ref 2.5–4.6)

## 2020-11-26 NOTE — Discharge Summary (Signed)
Physician Discharge Summary  Dennis Moreno IEP:[id number] DOB: [birth year] DOA: 11/20/2020  PCP: [name], PA  Admit date: 11/20/2020 Discharge date: 11/26/2020  Admitted From: Home Disposition: Home  Recommendations for Outpatient Follow-up:  1. Follow up with PCP in 1 week 2. Please obtain CK and CMP in 1 week 3. Please follow up on the following pending results: None  Home Health: None Equipment/Devices: None  Discharge Condition: Stable CODE STATUS: Full code Diet recommendation: Regular diet   Brief/Interim Summary:  Admission HPI written by Merlene Laughter, DO   Chief Complaint: Lower extremity pain.  HPI: Dennis Moreno is a 35 y.o. male with no significant past medical history who has not been doing any workouts since patient was having chronic back pain started doing workout on Monday that is around 4 days ago following day patient started having some lower extremity discomfort.  This became acutely worse over the last 24 hours and had come to the ER earlier today and was discharged home.  Following which patient CK level came back as 27,000.  Patient was called back to the ER.  Hospital course:   Rhabdomyolysis Secondary to extraneous workout. Non-traumatic. CK total of 27,646 on admission with improvement on IV fluids. Patient managed with NS IV fluids at 150 ml/hr which were increased to 200 ml/hr. CK of 2840 on discharge.  Elevated AST/ALT Secondary to rhabdomyolysis.  Hyperglycemia Negligible.   Hypocalcemia Mild. Resolved.  Normocytic anemia Mild.   Discharge Diagnoses:  Principal Problem:   Rhabdomyolysis    Discharge Instructions  Discharge Instructions    Increase activity slowly   Complete by: As directed      Allergies as of 11/26/2020      Reactions   Hydrocodone Itching      Medication List    STOP taking these medications   gabapentin 100 MG capsule Commonly known as: NEURONTIN   ibuprofen 600 MG  tablet Commonly known as: ADVIL   zolpidem 5 MG tablet Commonly known as: AMBIEN     TAKE these medications   BEE POLLEN PO Take 5 mLs by mouth daily.   benzonatate 100 MG capsule Commonly known as: TESSALON Take 1 capsule (100 mg total) by mouth 3 (three) times daily as needed for cough.   Fish Oil 1000 MG Caps Take 2 capsules by mouth daily.   fluticasone 50 MCG/ACT nasal spray Commonly known as: FLONASE Place 2 sprays into both nostrils daily.   MIDOL PO Take 1-2 tablets by mouth daily as needed (pain).   MULTIVITAMIN PO Take 1 capsule by mouth in the morning and at bedtime. [address], [place], Burdock Root   ondansetron 4 MG disintegrating tablet Commonly known as: Zofran ODT Take 1 tablet (4 mg total) by mouth every 8 (eight) hours as needed for nausea or vomiting.   PROBIOTIC PO Take 2 capsules by mouth daily.       Follow-up Information    [name], [place]. Schedule an appointment as soon as possible for a visit in 1 week(s).   Specialty: Physician Assistant Contact information: [address] [place] [address] [phone]        Narda Bonds, MD Follow up.   Specialty: Family Medicine Why: Hospital physician Contact information: [address] [place] [address] [phone]              Allergies  Allergen Reactions  . Hydrocodone Itching    Consultations:  None   Procedures/Studies:  US Abdomen Limited RUQ (LIVER/GB)  Result Date: 11/23/2020 CLINICAL DATA:  Abnormal liver function test. EXAM: ULTRASOUND ABDOMEN LIMITED RIGHT UPPER QUADRANT COMPARISON:  None. FINDINGS: Gallbladder: No gallstones or wall thickening visualized (1.9 mm). No sonographic Murphy sign noted by sonographer. Common bile duct: Diameter: 2.0 mm Liver: No focal lesion identified. Within normal limits in parenchymal echogenicity. Portal vein is patent on color Doppler imaging with normal direction of blood flow towards the liver.  Other: None. IMPRESSION: Normal right upper quadrant ultrasound. Electronically Signed   By: Aram Candela M.D.   On: 11/23/2020 17:27      Subjective: No issues overnight. Walking well. Leg pain improved.  Discharge Exam: Vitals:   11/25/20 2151 11/26/20 0552  BP: 119/84 136/73  Pulse: 65 60  Resp: 16 17  Temp: 98.3 F (36.8 C) 97.6 F (36.4 C)  SpO2: 100% 100%   Vitals:   11/25/20 0457 11/25/20 1342 11/25/20 2151 11/26/20 0552  BP: 108/74 134/82 119/84 136/73  Pulse: 60 65 65 60  Resp: 16 16 16 17   Temp: 97.6 F (36.4 C) 98.2 F (36.8 C) 98.3 F (36.8 C) 97.6 F (36.4 C)  TempSrc: Oral Oral Oral Oral  SpO2: 100% 100% 100% 100%  Weight:      Height:        General: Pt is alert, awake, not in acute distress Cardiovascular: RRR, S1/S2 +, no rubs, no gallops Respiratory: CTA bilaterally, no wheezing, no rhonchi Abdominal: Soft, NT, ND, bowel sounds + Extremities: no edema, no cyanosis    The results of significant diagnostics from this hospitalization (including imaging, microbiology, ancillary and laboratory) are listed below for reference.     Microbiology: Recent Results (from the past 240 hour(s))  SARS CORONAVIRUS 2 (TAT 6-24 HRS) Nasopharyngeal Nasopharyngeal Swab     Status: None   Collection Time: 11/20/20  9:19 PM   Specimen: Nasopharyngeal Swab  Result Value Ref Range Status   SARS Coronavirus 2 NEGATIVE NEGATIVE Final    Comment: (NOTE) SARS-CoV-2 target nucleic acids are NOT DETECTED.  The SARS-CoV-2 RNA is generally detectable in upper and lower respiratory specimens during the acute phase of infection. Negative results do not preclude SARS-CoV-2 infection, do not rule out co-infections with other pathogens, and should not be used as the sole basis for treatment or other patient management decisions. Negative results must be combined with clinical observations, patient history, and epidemiological information. The expected result is  Negative.  Fact Sheet for Patients: 01/20/21  Fact Sheet for Healthcare Providers: HairSlick.no  This test is not yet approved or cleared by the [place] FDA and  has been authorized for detection and/or diagnosis of SARS-CoV-2 by FDA under an Emergency Use Authorization (EUA). This EUA will remain  in effect (meaning this test can be used) for the duration of the COVID-19 declaration under Se ction 564(b)(1) of the Act, 21 U.S.C. section 360bbb-3(b)(1), unless the authorization is terminated or revoked sooner.  Performed at [facility], [address], [place], [place]      Labs: BNP (last 3 results) No results for input(s): BNP in the last 8760 hours. Basic Metabolic Panel: Recent Labs  Lab 11/22/20 0252 11/23/20 0339 11/24/20 0303 11/25/20 0303 11/26/20 0305  NA 139 138 139 140 143  K 4.0 4.4 3.8 4.2 3.9  CL 106 107 107 106 108  CO2 28 27 28 27 29   GLUCOSE 93 97 101* 92 100*  BUN 9 14 9 8 9   CREATININE  0.72 0.89 0.69 0.86 0.87  CALCIUM 8.5* 8.2* 8.4* 8.7* 8.9  MG 2.1 2.1 1.9 2.1 2.1  PHOS 4.0 3.6 3.7 4.3 4.8*   Liver Function Tests: Recent Labs  Lab 11/22/20 0252 11/23/20 0339 11/24/20 0303 11/25/20 0303 11/26/20 0305  AST 400* 335* 231* 185* 142*  ALT 121* 124* 126* 143* 147*  ALKPHOS 36* 36* 35* 41 42  BILITOT 0.8 0.5 0.4 0.4 0.6  PROT 6.1* 5.9* 5.8* 5.9* 5.7*  ALBUMIN 3.3* 3.2* 3.2* 3.4* 3.2*   No results for input(s): LIPASE, AMYLASE in the last 168 hours. No results for input(s): AMMONIA in the last 168 hours. CBC: Recent Labs  Lab 11/22/20 0252 11/23/20 0339 11/24/20 0303 11/25/20 0303 11/26/20 0305  WBC 5.4 5.7 5.6 4.8 5.4  NEUTROABS 1.5* 1.9 1.7 1.5* 1.6*  HGB 13.2 13.0 12.6* 13.2 12.4*  HCT 39.8 38.7* 37.4* 38.9* 36.8*  MCV 94.5 93.9 94.2 94.0 93.9  PLT 322 324 311 323 330   Cardiac Enzymes: Recent Labs  Lab 11/22/20 0252 11/23/20 0339  11/24/20 0303 11/25/20 0303 11/26/20 0305  CKTOTAL 22,192* 15,346* 7,698* 4,512* 2,840*   BNP: Invalid input(s): POCBNP CBG: No results for input(s): GLUCAP in the last 168 hours. D-Dimer No results for input(s): DDIMER in the last 72 hours. Hgb A1c No results for input(s): HGBA1C in the last 72 hours. Lipid Profile No results for input(s): CHOL, HDL, LDLCALC, TRIG, CHOLHDL, LDLDIRECT in the last 72 hours. Thyroid function studies No results for input(s): TSH, T4TOTAL, T3FREE, THYROIDAB in the last 72 hours.  Invalid input(s): FREET3 Anemia work up No results for input(s): VITAMINB12, FOLATE, FERRITIN, TIBC, IRON, RETICCTPCT in the last 72 hours. Urinalysis    Component Value Date/Time   COLORURINE YELLOW 11/20/2020 1232   APPEARANCEUR CLEAR 11/20/2020 1232   LABSPEC 1.015 11/20/2020 1232   PHURINE 7.5 11/20/2020 1232   GLUCOSEU NEGATIVE 11/20/2020 1232   HGBUR NEGATIVE 11/20/2020 1232   BILIRUBINUR NEGATIVE 11/20/2020 1232   KETONESUR NEGATIVE 11/20/2020 1232   PROTEINUR NEGATIVE 11/20/2020 1232   UROBILINOGEN 0.2 01/24/2011 1745   NITRITE NEGATIVE 11/20/2020 1232   LEUKOCYTESUR NEGATIVE 11/20/2020 1232   Sepsis Labs Invalid input(s): PROCALCITONIN,  WBC,  LACTICIDVEN Microbiology Recent Results (from the past 240 hour(s))  SARS CORONAVIRUS 2 (TAT 6-24 HRS) Nasopharyngeal Nasopharyngeal Swab     Status: None   Collection Time: 11/20/20  9:19 PM   Specimen: Nasopharyngeal Swab  Result Value Ref Range Status   SARS Coronavirus 2 NEGATIVE NEGATIVE Final    Comment: (NOTE) SARS-CoV-2 target nucleic acids are NOT DETECTED.  The SARS-CoV-2 RNA is generally detectable in upper and lower respiratory specimens during the acute phase of infection. Negative results do not preclude SARS-CoV-2 infection, do not rule out co-infections with other pathogens, and should not be used as the sole basis for treatment or other patient management decisions. Negative results must be  combined with clinical observations, patient history, and epidemiological information. The expected result is Negative.  Fact Sheet for Patients: HairSlick.no  Fact Sheet for Healthcare Providers: quierodirigir.com  This test is not yet approved or cleared by the [place] FDA and  has been authorized for detection and/or diagnosis of SARS-CoV-2 by FDA under an Emergency Use Authorization (EUA). This EUA will remain  in effect (meaning this test can be used) for the duration of the COVID-19 declaration under Se ction 564(b)(1) of the Act, 21 U.S.C. section 360bbb-3(b)(1), unless the authorization is terminated or revoked sooner.  Performed at [facility], [address] [place], [place] [address]      Time coordinating discharge: 35 minutes  SIGNED:   Jacquelin Hawkingalph Quinesha Selinger, MD [facility] 11/26/2020, 12:00 PM

## 2020-11-27 ENCOUNTER — Telehealth: Payer: Self-pay

## 2020-11-27 NOTE — Telephone Encounter (Cosign Needed)
Transition Care Management Follow-up Telephone Call  Date of discharge and from where: 11/26/20 From [facility]   How have you been since you were released from the hospital? ok  Any questions or concerns? No  Items Reviewed:  Did the pt receive and understand the discharge instructions provided? Yes   Medications obtained and verified? Yes   Other? No   Any new allergies since your discharge? No   Dietary orders reviewed? Yes  Do you have support at home? Yes   Home Care and Equipment/Supplies: Were home health services ordered? not applicable If so, what is the name of the agency?  Has the agency set up a time to come to the patient's home? not applicable Were any new equipment or medical supplies ordered?  No What is the name of the medical supply agency?  Were you able to get the supplies/equipment? not applicable Do you have any questions related to the use of the equipment or supplies? No  Functional Questionnaire: (I = Independent and D = Dependent) ADLs: I  Bathing/Dressing- I  Meal Prep- I  Eating- I  Maintaining continence- I  Transferring/Ambulation- I  Managing Meds- I  Follow up appointments reviewed:   PCP Hospital f/u appt confirmed? Yes  Scheduled to see Dr Bufford Buttner  on 12/03/20 @ 2:30 pm.  Specialist Hospital f/u appt confirmed? No    Are transportation arrangements needed? No   If their condition worsens, is the pt aware to call PCP or go to the Emergency Dept.? Yes  Was the patient provided with contact information for the PCP's office or ED? Yes  Was to pt encouraged to call back with questions or concerns? Yes

## 2020-12-03 ENCOUNTER — Encounter (HOSPITAL_BASED_OUTPATIENT_CLINIC_OR_DEPARTMENT_OTHER): Payer: Self-pay

## 2020-12-03 ENCOUNTER — Inpatient Hospital Stay: Payer: Federal, State, Local not specified - PPO | Admitting: Physician Assistant

## 2020-12-03 ENCOUNTER — Other Ambulatory Visit: Payer: Self-pay

## 2020-12-03 ENCOUNTER — Emergency Department (HOSPITAL_BASED_OUTPATIENT_CLINIC_OR_DEPARTMENT_OTHER)
Admission: EM | Admit: 2020-12-03 | Discharge: 2020-12-03 | Disposition: A | Discharge status: Home / Self Care | Payer: Federal, State, Local not specified - PPO | Attending: Emergency Medicine | Admitting: Emergency Medicine

## 2020-12-03 DIAGNOSIS — Z87891 Personal history of nicotine dependence: Secondary | ICD-10-CM | POA: Diagnosis not present

## 2020-12-03 DIAGNOSIS — Z09 Encounter for follow-up examination after completed treatment for conditions other than malignant neoplasm: Secondary | ICD-10-CM

## 2020-12-03 DIAGNOSIS — R799 Abnormal finding of blood chemistry, unspecified: Secondary | ICD-10-CM | POA: Insufficient documentation

## 2020-12-03 DIAGNOSIS — Z0000 Encounter for general adult medical examination without abnormal findings: Secondary | ICD-10-CM | POA: Diagnosis not present

## 2020-12-03 LAB — CK: Total CK: 339 U/L (ref 49–397)

## 2020-12-03 NOTE — Discharge Instructions (Signed)
Your CK was normal.  Please return to the ER for any new or worsening symptoms

## 2020-12-03 NOTE — ED Provider Notes (Signed)
[facility] EMERGENCY DEPARTMENT Provider Note   CSN: [id number] Arrival date & time: 12/03/20  1819     History Chief Complaint  Patient presents with  . Abnormal Lab    Dennis Moreno is a 35 y.o. male.  HPI 35 year old male with a history of depression, rhabdo presents to the ER for a lab recheck.  Patient was seen here several weeks ago and was found to be in nontraumatic rhabdo after working out.  He was discharged and was told to have his CK rechecked before he resumed any activity.  He states he has been feeling well.  He states that he does not want to follow-up with his past PCP anymore and thus did not call them to schedule recheck.    Past Medical History:  Diagnosis Date  . Depression     Patient Active Problem List   Diagnosis Date Noted  . Rhabdomyolysis 11/20/2020  . Low back pain 04/18/2020  . Depression, major, single episode, moderate (HCC) 10/10/2017  . Anxiety 10/10/2017    Past Surgical History:  Procedure Laterality Date  . WISDOM TOOTH EXTRACTION Bilateral 2001       Family History  Problem Relation Age of Onset  . Arthritis Mother   . Depression Mother   . Alcohol abuse Father   . Depression Father   . Depression Sister   . Depression Brother   . Alcohol abuse Maternal Grandfather   . Alcohol abuse Paternal Grandfather   . Depression Paternal Grandfather   . Depression Brother     Social History   Tobacco Use  . Smoking status: Former Smoker    Types: Cigarettes  . Smokeless tobacco: Never Used  Vaping Use  . Vaping Use: Never used  Substance Use Topics  . Alcohol use: Not Currently  . Drug use: Not Currently    Types: Marijuana    Home Medications Prior to Admission medications   Medication Sig Start Date End Date Taking? Authorizing Provider  Acetaminophen (MIDOL PO) Take 1-2 tablets by mouth daily as needed (pain). Patient not taking: Reported on 11/27/2020    [provider]  BEE POLLEN PO Take 5  mLs by mouth daily.    [provider]  benzonatate (TESSALON) 100 MG capsule Take 1 capsule (100 mg total) by mouth 3 (three) times daily as needed for cough. Patient not taking: Reported on 11/27/2020 11/12/20   Freddy Finner, NP  fluticasone [facility]) 50 MCG/ACT nasal spray Place 2 sprays into both nostrils daily. Patient not taking: Reported on 11/27/2020 11/12/20   Freddy Finner, NP  Multiple Vitamin (MULTIVITAMIN PO) Take 1 capsule by mouth in the morning and at bedtime. [address], [place], Burdock Root    [provider]  Omega-3 Fatty Acids (FISH OIL) 1000 MG CAPS Take 2 capsules by mouth daily.    [provider]  ondansetron (ZOFRAN ODT) 4 MG disintegrating tablet Take 1 tablet (4 mg total) by mouth every 8 (eight) hours as needed for nausea or vomiting. Patient not taking: Reported on 11/27/2020 10/15/20   Alric Seton, MD  Probiotic Product (PROBIOTIC PO) Take 2 capsules by mouth daily.    [provider]    Allergies    Hydrocodone  Review of Systems   Review of Systems  Musculoskeletal: Negative for myalgias.    Physical Exam Updated Vital Signs BP (!) 108/93 (BP Location: Right Arm)   Pulse 64   Temp 98.3 F (36.8 C) (Oral)  Resp 18   Ht 5\' 9"  (1.753 m)   Wt 68.9 kg   SpO2 100%   BMI 22.45 kg/m   Physical Exam Vitals reviewed.  Constitutional:      Appearance: Normal appearance. He is not ill-appearing.  HENT:     Head: Normocephalic and atraumatic.  Eyes:     General:        Right eye: No discharge.        Left eye: No discharge.     Extraocular Movements: Extraocular movements intact.     Conjunctiva/sclera: Conjunctivae normal.  Musculoskeletal:        General: No swelling. Normal range of motion.  Neurological:     General: No focal deficit present.     Mental Status: He is alert and oriented to person, place, and time.  Psychiatric:        Mood and Affect: Mood normal.        Behavior: Behavior normal.      ED Results / Procedures / Treatments   Labs (all labs ordered are listed, but only abnormal results are displayed) Labs Reviewed  CK    EKG None  Radiology No results found.  Procedures Procedures   Medications Ordered in ED Medications - No data to display  ED Course  I have reviewed the triage vital signs and the nursing notes.  Pertinent labs & imaging results that were available during my care of the patient were reviewed by me and considered in my medical decision making (see chart for details).    MDM Rules/Calculators/A&P                          35 year old male here for CK recheck.  Feeling well, no complaints.  CK normal.  Return precautions given.  Stable for discharge Final Clinical Impression(s) / ED Diagnoses Final diagnoses:  Examination for, follow-up    Rx / DC Orders ED Discharge Orders    None       33 12/03/20 1904    12/05/20, MD 12/03/20 [phone]

## 2020-12-03 NOTE — ED Triage Notes (Signed)
Pt arrives ambulatory to ED with reports of needing his CK levels checked states that he was admitted to [facility] but left prior to them wanting him to, was unable to get in at his PCP for CK recheck.

## 2020-12-04 ENCOUNTER — Ambulatory Visit (HOSPITAL_COMMUNITY): Payer: Federal, State, Local not specified - PPO | Admitting: Licensed Clinical Social Worker

## 2020-12-18 ENCOUNTER — Other Ambulatory Visit: Payer: Self-pay

## 2020-12-18 ENCOUNTER — Ambulatory Visit (INDEPENDENT_AMBULATORY_CARE_PROVIDER_SITE_OTHER): Payer: Federal, State, Local not specified - PPO | Admitting: Licensed Clinical Social Worker

## 2020-12-18 DIAGNOSIS — F1994 Other psychoactive substance use, unspecified with psychoactive substance-induced mood disorder: Secondary | ICD-10-CM | POA: Diagnosis not present

## 2020-12-18 DIAGNOSIS — F1010 Alcohol abuse, uncomplicated: Secondary | ICD-10-CM

## 2020-12-18 NOTE — Progress Notes (Signed)
Virtual Visit via Telephone Note  I connected with Dennis Moreno on 12/18/20 at  8:00 AM EDT by telephone and verified that I am speaking with the correct person using two identifiers.  Location: Patient: home Provider: office   I discussed the limitations, risks, security and privacy concerns of performing an evaluation and management service by telephone and the availability of in person appointments. I also discussed with the patient that there may be a patient responsible charge related to this service. The patient expressed understanding and agreed to proceed.   I discussed the assessment and treatment plan with the patient. The patient was provided an opportunity to ask questions and all were answered. The patient agreed with the plan and demonstrated an understanding of the instructions.   The patient was advised to call back or seek an in-person evaluation if the symptoms worsen or if the condition fails to improve as anticipated.  I provided 45 minutes of non-face-to-face time during this encounter.   [name], LCSW    THERAPIST PROGRESS NOTE  Session Time: 803am-850am  Participation Level: Active  Behavioral Response: NAAlertAnxious, Dysphoric and Irritable  Type of Therapy: Individual Therapy  Treatment Goals addressed: Coping  Interventions: CBT and Supportive  Summary: Dennis Moreno is a 35 y.o. male who presents with alcohol abuse and symptoms of substance induced mood disorder. Client processed frustration with his personal growth vs perceived others lack of personal growth, specifically related to his childrens mothers. Client reported attempting new communication skills reviewed in previous sessions with little effect on childrens mother. Client was receptive to information on ACOA and effect on relationships with others. Client acknowledged need to focus on self but struggling with not feeling responsible for others' response to his boundaries.  Client showed progress toward goal of achieving and maintaining sobriety AEB no substance use since assessment.   Suicidal/Homicidal: Nowithout intent/plan  Therapist Response: Clinician checked in with client, assessed for SI/HI/psychosis and overall level of functioning including substance use. Clinician reviewed with client assertive communication previously reviewed as well as boundary setting and upholding. Clinician presented psycho-education on ACOA and laundry list, reviewing effect on relationships and boundaries. Clinician challenged client to focus on personal circle of control and not accept responsibility for others' actions.  Plan: Return again in 2-3 weeks.  Diagnosis: Axis I: Alcohol Abuse     [name], LCSW 12/18/2020

## 2020-12-19 ENCOUNTER — Encounter: Payer: Self-pay | Admitting: Physician Assistant

## 2020-12-19 ENCOUNTER — Telehealth: Payer: Federal, State, Local not specified - PPO | Admitting: Physician Assistant

## 2020-12-19 DIAGNOSIS — R6889 Other general symptoms and signs: Secondary | ICD-10-CM | POA: Diagnosis not present

## 2020-12-19 DIAGNOSIS — Z8739 Personal history of other diseases of the musculoskeletal system and connective tissue: Secondary | ICD-10-CM

## 2020-12-19 NOTE — Patient Instructions (Signed)
Instructions sent to patients mychart

## 2020-12-19 NOTE — Progress Notes (Signed)
Dennis Moreno, Dennis Moreno are scheduled for a virtual visit with your provider today.    Just as we do with appointments in the office, we must obtain your consent to participate.  Your consent will be active for this visit and any virtual visit you may have with one of our providers in the next 365 days.    If you have a MyChart account, I can also send a copy of this consent to you electronically.  All virtual visits are billed to your insurance company just like a traditional visit in the office.  As this is a virtual visit, video technology does not allow for your provider to perform a traditional examination.  This may limit your provider's ability to fully assess your condition.  If your provider identifies any concerns that need to be evaluated in person or the need to arrange testing such as labs, EKG, etc, we will make arrangements to do so.    Although advances in technology are sophisticated, we cannot ensure that it will always work on either your end or our end.  If the connection with a video visit is poor, we may have to switch to a telephone visit.  With either a video or telephone visit, we are not always able to ensure that we have a secure connection.   I need to obtain your verbal consent now.   Are you willing to proceed with your visit today?   Dennis Moreno has provided verbal consent on 12/19/2020 for a virtual visit (video or telephone).  Dennis Climes, PA-C 12/19/2020  9:35 AM  Virtual Visit via Video   I connected with patient on 12/19/20 at  9:30 AM EDT by a video enabled telemedicine application and verified that I am speaking with the correct person using two identifiers.  Location patient: Home Location provider: [facility] Persons participating in the virtual visit: Patient, Provider  I discussed the limitations of evaluation and management by telemedicine and the availability of in person appointments. The patient expressed understanding and agreed  to proceed.  Subjective:   HPI:   Patient presents via [facility] today c/o 1 day of mild body aches, headache and sore throat. Notes this morning headache improved and just barely present as is the sore throat but still with mild ache and chills despite no fever. Notes some nausea last night without vomiting that has resolved. Some nasal congestion noted. Is concerned he has COVID or th flu giving he has been around multiple family members who have been sick recently. No confirmed COVID cases. Patient denies any diarrhea, abdominal pain, rash, urinary changes. Denies cough or SOB. Denies chest pain. Is keeping well hydrated. Notes good urinary output with light yellow urine. Of note, patient hospitalized just over a month ago from rhabdomyolysis from excessive resistance training. Has been foregoing workout since that time. Last CK was within normal range. Denies any issues since hospitalization.  ROS:   See pertinent positives and negatives per HPI.  Patient Active Problem List   Diagnosis Date Noted  . Rhabdomyolysis 11/20/2020  . Low back pain 04/18/2020  . Depression, major, single episode, moderate (HCC) 10/10/2017  . Anxiety 10/10/2017    Social History   Tobacco Use  . Smoking status: Former Smoker    Types: Cigarettes  . Smokeless tobacco: Never Used  Substance Use Topics  . Alcohol use: Not Currently    Current Outpatient Medications:  .  Acetaminophen (MIDOL PO), Take 1-2 tablets by mouth daily  as needed (pain). (Patient not taking: Reported on 11/27/2020), Disp: , Rfl:  .  BEE POLLEN PO, Take 5 mLs by mouth daily., Disp: , Rfl:  .  benzonatate (TESSALON) 100 MG capsule, Take 1 capsule (100 mg total) by mouth 3 (three) times daily as needed for cough. (Patient not taking: Reported on 11/27/2020), Disp: 30 capsule, Rfl: 0 .  fluticasone (FLONASE) 50 MCG/ACT nasal spray, Place 2 sprays into both nostrils daily. (Patient not taking: Reported on 11/27/2020), Disp: 16 g, Rfl: 0 .   Multiple Vitamin (MULTIVITAMIN PO), Take 1 capsule by mouth in the morning and at bedtime. [address], [place], [place], Disp: , Rfl:  .  Omega-3 Fatty Acids (FISH OIL) 1000 MG CAPS, Take 2 capsules by mouth daily., Disp: , Rfl:  .  ondansetron (ZOFRAN ODT) 4 MG disintegrating tablet, Take 1 tablet (4 mg total) by mouth every 8 (eight) hours as needed for nausea or vomiting. (Patient not taking: Reported on 11/27/2020), Disp: 20 tablet, Rfl: 0 .  Probiotic Product (PROBIOTIC PO), Take 2 capsules by mouth daily., Disp: , Rfl:   Allergies  Allergen Reactions  . Hydrocodone Itching    Objective:   There were no vitals taken for this visit.  Patient is well-developed, well-nourished in no acute distress.  Resting comfortably at home.  Head is normocephalic, atraumatic.  No labored breathing.  Speech is clear and coherent with logical content.  Patient is alert and oriented at baseline.   Assessment and Plan:   1. Flu-like symptoms Flu versus COVID versus other seasonal viral illness most likely. Has recent history of rhabdomyolysis but no current issue with stool changes, urine change, fever or significant muscle aches. Discussed I wanted him to be COVID and flu tested ASAP as if one of these are positive we have a cause of symptoms and can treat accordingly. Would refer to COVID treatment team giving health issues for more tailored outpatient treatment. Discussed if he cannot get tested today, I want him to be seen in person at a local Urgent Care for examination, testing and labs to recheck renal and liver function. Same if he is tested today but anything worsens. Strict ER precautions also reviewed with patient who voiced understanding and agreement with the plan.     Dennis Moreno, [place] 12/19/2020

## 2021-01-01 ENCOUNTER — Telehealth (HOSPITAL_COMMUNITY): Payer: Self-pay | Admitting: Licensed Clinical Social Worker

## 2021-01-01 ENCOUNTER — Other Ambulatory Visit: Payer: Self-pay

## 2021-01-01 ENCOUNTER — Ambulatory Visit (INDEPENDENT_AMBULATORY_CARE_PROVIDER_SITE_OTHER): Payer: Federal, State, Local not specified - PPO | Admitting: Licensed Clinical Social Worker

## 2021-01-01 DIAGNOSIS — F1010 Alcohol abuse, uncomplicated: Secondary | ICD-10-CM | POA: Diagnosis not present

## 2021-01-01 DIAGNOSIS — F1994 Other psychoactive substance use, unspecified with psychoactive substance-induced mood disorder: Secondary | ICD-10-CM | POA: Diagnosis not present

## 2021-01-07 NOTE — Progress Notes (Signed)
Virtual Visit via Telephone Note  I connected with Dennis Moreno on 01/01/2021 at  8:00 AM EDT by telephone and verified that I am speaking with the correct person using two identifiers.  Location: Patient: home Provider: office   I discussed the limitations, risks, security and privacy concerns of performing an evaluation and management service by telephone and the availability of in person appointments. I also discussed with the patient that there may be a patient responsible charge related to this service. The patient expressed understanding and agreed to proceed.  I discussed the assessment and treatment plan with the patient. The patient was provided an opportunity to ask questions and all were answered. The patient agreed with the plan and demonstrated an understanding of the instructions.   The patient was advised to call back or seek an in-person evaluation if the symptoms worsen or if the condition fails to improve as anticipated.  I provided 30 minutes of non-face-to-face time during this encounter.   [name], LCSW    THERAPIST PROGRESS NOTE  Session Time: 815am-830am  Participation Level: Active  Behavioral Response: NAAlertDysphoric  Type of Therapy: Individual Therapy  Treatment Goals addressed: Coping and Diagnosis: achieve/maintain sobriety from all mind altering substances 7/7 days weekly  Interventions: Motivational Interviewing and Supportive  Summary: Dennis Moreno is a 35 y.o. male who presents with substance use disorder and substance induced mood disorder. Client reported relapse based on change in recent stressors related to relationship dynamics. Client identified pattern of loosening boundaries on the same timeline as increase in 'addict' behaviors. Client showed progress toward goal AEB plans to strengthen boundaries with unhealthy 'people, places, and things' and re-engage with self care as part of relapse prevention plan, including  exercise and improved eating.   Suicidal/Homicidal: Nowithout intent/plan  Therapist Response: Clinician checked in with client, assessed for SI/HI/psychosis and overall level of functioning. Clinician processed with client changes in behavior prior to use which could have signaled the start of a relapse. Clinician validated client use of previously helpful coping skills as 'fall back' for coping when feeling overwhelmed. Clinician worked with client to review relapse prevention plan and set daily goals to improve mood which client identified as previously improved with sobriety.  Plan: Return again in 2-4 weeks.  Diagnosis: Axis I: alcohol abuse, cannabis abuse, r/o substance induced mood disorder       [name], LCSW 01/01/2021

## 2021-02-24 ENCOUNTER — Ambulatory Visit (HOSPITAL_COMMUNITY): Payer: Federal, State, Local not specified - PPO | Admitting: Licensed Clinical Social Worker

## 2021-02-24 ENCOUNTER — Other Ambulatory Visit: Payer: Self-pay

## 2021-02-24 ENCOUNTER — Telehealth (HOSPITAL_COMMUNITY): Payer: Self-pay | Admitting: Licensed Clinical Social Worker

## 2021-02-26 ENCOUNTER — Ambulatory Visit (INDEPENDENT_AMBULATORY_CARE_PROVIDER_SITE_OTHER): Payer: Federal, State, Local not specified - PPO | Admitting: Licensed Clinical Social Worker

## 2021-02-26 ENCOUNTER — Other Ambulatory Visit: Payer: Self-pay

## 2021-02-26 DIAGNOSIS — F1010 Alcohol abuse, uncomplicated: Secondary | ICD-10-CM | POA: Diagnosis not present

## 2021-02-26 DIAGNOSIS — F1994 Other psychoactive substance use, unspecified with psychoactive substance-induced mood disorder: Secondary | ICD-10-CM

## 2021-03-12 ENCOUNTER — Other Ambulatory Visit: Payer: Self-pay

## 2021-03-12 ENCOUNTER — Ambulatory Visit (HOSPITAL_COMMUNITY): Payer: Federal, State, Local not specified - PPO | Admitting: Licensed Clinical Social Worker

## 2021-03-12 DIAGNOSIS — F1010 Alcohol abuse, uncomplicated: Secondary | ICD-10-CM

## 2021-03-12 NOTE — Progress Notes (Signed)
Virtual Visit via Telephone Note  I connected with Dennis Moreno on 03/12/21 at  8:00 AM EDT by telephone and verified that I am speaking with the correct person using two identifiers.  Location: Patient: home  Provider: office   I discussed the limitations, risks, security and privacy concerns of performing an evaluation and management service by telephone and the availability of in person appointments. I also discussed with the patient that there may be a patient responsible charge related to this service. The patient expressed understanding and agreed to proceed.    I discussed the assessment and treatment plan with the patient. The patient was provided an opportunity to ask questions and all were answered. The patient agreed with the plan and demonstrated an understanding of the instructions.   The patient was advised to call back or seek an in-person evaluation if the symptoms worsen or if the condition fails to improve as anticipated.  I provided 0 minutes of non-face-to-face time during this encounter.   [name], LCSW    THERAPIST PROGRESS NOTE  Session Time: NA  Participation Level: NA  Behavioral Response: NA  Type of Therapy: Individual Therapy  Treatment Goals addressed: Anxiety, Coping, and Diagnosis: alcohol abuse  Interventions: motivational interviewing  Summary: Client did not respond to attempted outreach for scheduled appointment.  Suicidal/Homicidal: Nowithout intent/plan  Therapist Response: Attempted outreach.  Plan: Return again in 2-3 weeks.  Diagnosis: Axis I: Alcohol Abuse and Major Depression, Recurrent severe        [name], LCSW 03/12/2021

## 2021-03-26 ENCOUNTER — Other Ambulatory Visit: Payer: Self-pay

## 2021-03-26 ENCOUNTER — Ambulatory Visit (HOSPITAL_COMMUNITY): Payer: Federal, State, Local not specified - PPO | Admitting: Licensed Clinical Social Worker

## 2021-04-01 NOTE — Progress Notes (Signed)
Virtual Visit via Telephone Note  I connected with Dennis Moreno on 02/26/21 at  8:00 AM EDT by telephone and verified that I am speaking with the correct person using two identifiers.  Location: Patient: home Provider: office   I discussed the limitations, risks, security and privacy concerns of performing an evaluation and management service by telephone and the availability of in person appointments. I also discussed with the patient that there may be a patient responsible charge related to this service. The patient expressed understanding and agreed to proceed.  I discussed the assessment and treatment plan with the patient. The patient was provided an opportunity to ask questions and all were answered. The patient agreed with the plan and demonstrated an understanding of the instructions.   The patient was advised to call back or seek an in-person evaluation if the symptoms worsen or if the condition fails to improve as anticipated.  I provided 30 minutes of non-face-to-face time during this encounter.   [name], LCSW    THERAPIST PROGRESS NOTE  Session Time: 820am-855am  Participation Level: Active  Behavioral Response: NAAlertAnxious and Dysphoric  Type of Therapy: Individual Therapy  Treatment Goals addressed: Coping and Diagnosis: alcohol abuse  Interventions: CBT, Motivational Interviewing, and Supportive  Summary: Dennis Moreno is a 35 y.o. male who presents with alcohol use disorder, substance induced mood disorder vs MDD with anxious features. Client processed triggers and challenges related to recent relapses. Client was able to identify patterns to relapse and create relapse prevention plan for known triggers. Client processed ongoing frustration with childrens' mother and feeling used. Client in agreement to continue seeking referral for medication management, stated he did reach out to previous referral and would consider talking to PCP after getting  new PCP.  Suicidal/Homicidal: Nowithout intent/plan  Therapist Response: Clinician checked in with client, assessed for SI/HI/psychosis and overall level of functioning. Clinician utilize OARs from Motivational interviewing in response to client relapse. Clinician challenged distorted thinking related to expectations. Clinician and client reviewed relapse prevention options which were previously effective. Clinician encouraged focus on healthy behaviors in client control rather than trying to get specific responses from others.  Plan: Return again in 2 weeks.  Diagnosis: Axis I: Alcohol Abuse        [name], LCSW 02/26/21

## 2021-05-28 ENCOUNTER — Other Ambulatory Visit: Payer: Self-pay

## 2021-05-28 ENCOUNTER — Telehealth (HOSPITAL_COMMUNITY): Payer: Self-pay | Admitting: Psychiatry

## 2021-05-28 ENCOUNTER — Ambulatory Visit (INDEPENDENT_AMBULATORY_CARE_PROVIDER_SITE_OTHER): Payer: Federal, State, Local not specified - PPO | Admitting: Licensed Clinical Social Worker

## 2021-05-28 DIAGNOSIS — F1010 Alcohol abuse, uncomplicated: Secondary | ICD-10-CM | POA: Diagnosis not present

## 2021-05-28 DIAGNOSIS — F1994 Other psychoactive substance use, unspecified with psychoactive substance-induced mood disorder: Secondary | ICD-10-CM | POA: Diagnosis not present

## 2021-05-28 NOTE — Progress Notes (Signed)
Virtual Visit via Telephone Note  I connected with Dennis Moreno on 05/28/21 at  8:00 AM EDT by telephone and verified that I am speaking with the correct person using two identifiers.  Location: Patient: home Provider: office   I discussed the limitations, risks, security and privacy concerns of performing an evaluation and management service by telephone and the availability of in person appointments. I also discussed with the patient that there may be a patient responsible charge related to this service. The patient expressed understanding and agreed to proceed.  I discussed the assessment and treatment plan with the patient. The patient was provided an opportunity to ask questions and all were answered. The patient agreed with the plan and demonstrated an understanding of the instructions.   The patient was advised to call back or seek an in-person evaluation if the symptoms worsen or if the condition fails to improve as anticipated.  I provided 45 minutes of non-face-to-face time during this encounter.   [name], LCSW    THERAPIST PROGRESS NOTE  Session Time: [phone]  Participation Level: Active  Behavioral Response: NAAlertAnxious and Dysphoric  Type of Therapy: Individual Therapy  Treatment Goals addressed: Anxiety and Coping  Interventions: CBT and Supportive  Summary: Dennis Moreno is a 35 y.o. male who presents with symptoms of depression, anxiety, and substance use.   Suicidal/Homicidal: Nowithout intent/plan  Therapist Response: Clinician checked in with client, assessed for SI/HI/psychosis and overall level of functioning. Clinician challenged client on avoiding work due to anxiety but not being in contact to discuss options. Clinician processed with client behaviors modeled when younger related to self reliance and thoughts and feelings relating to asking for help from others. Clinician provided education on higher levels of care and referral was  made to [facility] program to address increased anxiety and depression.  Plan: referral made for [facility] IOP.  Diagnosis: Axis I: Alcohol Abuse and unspecified anxiety, unspecified depression  vs substance induced mood disorder       [name], LCSW 05/28/2021

## 2021-05-28 NOTE — Telephone Encounter (Signed)
D:  Dennis Reek, LCSW, LCAS referred pt to [facility].  A:  Called and oriented pt.  Encouraged pt to verify his benefits.  Pt will start [facility] on 06-01-21 @ 9 a.m.  Inform Waldon Merl.  R:  Pt receptive.

## 2021-06-01 ENCOUNTER — Other Ambulatory Visit (HOSPITAL_COMMUNITY): Payer: Federal, State, Local not specified - PPO | Attending: Psychiatry | Admitting: Psychiatry

## 2021-06-01 ENCOUNTER — Other Ambulatory Visit: Payer: Self-pay

## 2021-06-01 ENCOUNTER — Telehealth (HOSPITAL_COMMUNITY): Payer: Self-pay | Admitting: Psychiatry

## 2021-06-02 ENCOUNTER — Other Ambulatory Visit: Payer: Self-pay

## 2021-06-02 ENCOUNTER — Other Ambulatory Visit (HOSPITAL_COMMUNITY): Payer: Federal, State, Local not specified - PPO

## 2021-06-03 ENCOUNTER — Other Ambulatory Visit (HOSPITAL_COMMUNITY): Payer: Federal, State, Local not specified - PPO

## 2021-06-03 ENCOUNTER — Other Ambulatory Visit: Payer: Self-pay

## 2021-06-04 ENCOUNTER — Ambulatory Visit (HOSPITAL_COMMUNITY): Payer: Federal, State, Local not specified - PPO

## 2021-06-05 ENCOUNTER — Ambulatory Visit (HOSPITAL_COMMUNITY): Payer: Federal, State, Local not specified - PPO

## 2021-06-08 ENCOUNTER — Ambulatory Visit (HOSPITAL_COMMUNITY): Payer: Federal, State, Local not specified - PPO

## 2021-06-09 ENCOUNTER — Ambulatory Visit (HOSPITAL_COMMUNITY): Payer: Federal, State, Local not specified - PPO

## 2021-06-10 ENCOUNTER — Ambulatory Visit (HOSPITAL_COMMUNITY): Payer: Federal, State, Local not specified - PPO

## 2021-06-11 ENCOUNTER — Telehealth (HOSPITAL_COMMUNITY): Payer: Self-pay | Admitting: Licensed Clinical Social Worker

## 2021-06-11 ENCOUNTER — Ambulatory Visit (HOSPITAL_COMMUNITY): Payer: Federal, State, Local not specified - PPO | Admitting: Licensed Clinical Social Worker

## 2021-06-11 ENCOUNTER — Other Ambulatory Visit: Payer: Self-pay

## 2021-06-12 ENCOUNTER — Ambulatory Visit (HOSPITAL_COMMUNITY): Payer: Federal, State, Local not specified - PPO

## 2021-06-17 ENCOUNTER — Other Ambulatory Visit: Payer: Self-pay

## 2021-06-17 ENCOUNTER — Ambulatory Visit (INDEPENDENT_AMBULATORY_CARE_PROVIDER_SITE_OTHER): Payer: Federal, State, Local not specified - PPO | Admitting: Licensed Clinical Social Worker

## 2021-06-17 DIAGNOSIS — F1010 Alcohol abuse, uncomplicated: Secondary | ICD-10-CM

## 2021-06-17 DIAGNOSIS — F1994 Other psychoactive substance use, unspecified with psychoactive substance-induced mood disorder: Secondary | ICD-10-CM | POA: Diagnosis not present

## 2021-06-17 NOTE — Progress Notes (Signed)
Virtual Visit via Telephone Note  I connected with Dennis Moreno on 06/17/21 at  8:00 AM EDT by telephone and verified that I am speaking with the correct person using two identifiers.  Location: Patient: home Provider: office   I discussed the limitations, risks, security and privacy concerns of performing an evaluation and management service by telephone and the availability of in person appointments. I also discussed with the patient that there may be a patient responsible charge related to this service. The patient expressed understanding and agreed to proceed. I discussed the assessment and treatment plan with the patient. The patient was provided an opportunity to ask questions and all were answered. The patient agreed with the plan and demonstrated an understanding of the instructions.   The patient was advised to call back or seek an in-person evaluation if the symptoms worsen or if the condition fails to improve as anticipated.  I provided 45 minutes of non-face-to-face time during this encounter.   [name], LCSW    THERAPIST PROGRESS NOTE  Session Time: [phone]  Participation Level: Active  Behavioral Response: NAAlertDysphoric and Irritable  Type of Therapy: Individual Therapy  Treatment Goals addressed: Anxiety and Coping  Interventions: CBT, Motivational Interviewing, and Supportive  Summary: Dennis Moreno is a 35 y.o. male who presents with alcohol use disorder, substance induced mood disorder. Client processed relationship with both children's mothers and frustration related to legal involvement and both wanting to be around children and wanting to avoid trouble with their mothers. Client identified behaviors engaged in while intoxicated which would not have occurred sober. Client processed consequences of loosening boundaries with peers and frustration with "their lack of growth."  Suicidal/Homicidal: No  Therapist Response: Clinician checked in  with client, assessed for SI/HI/psychosis and overall level of functioning including substance use. Clinician reviewed with client previously implemented boundaries and consequences of not reinforcing boundaries for self and others. Clinician challenged client to focus on growth of self rather than lack of perceived growth of others. Clinician inquired which behaviors he was completing while sober which are no longer in place and encouraged re-engaging in previously supportive behaviors.  Plan: Return again in 2-3 weeks.  Diagnosis: Axis I: Alcohol Abuse and Substance Induced Mood Disorder       [name], LCSW 06/17/2021

## 2021-06-25 ENCOUNTER — Telehealth: Payer: Federal, State, Local not specified - PPO | Admitting: Physician Assistant

## 2021-06-25 DIAGNOSIS — M79661 Pain in right lower leg: Secondary | ICD-10-CM

## 2021-06-25 MED ORDER — MELOXICAM 15 MG PO TABS: 15.0000 mg | ORAL_TABLET | Freq: Every day | ORAL | 0 refills | Status: DC

## 2021-06-25 NOTE — Progress Notes (Signed)
Virtual Visit Consent   Dennis Moreno, you are scheduled for a virtual visit with a [facility] provider today.     Just as with appointments in the office, your consent must be obtained to participate.  Your consent will be active for this visit and any virtual visit you may have with one of our providers in the next 365 days.     If you have a MyChart account, a copy of this consent can be sent to you electronically.  All virtual visits are billed to your insurance company just like a traditional visit in the office.    As this is a virtual visit, video technology does not allow for your provider to perform a traditional examination.  This may limit your provider's ability to fully assess your condition.  If your provider identifies any concerns that need to be evaluated in person or the need to arrange testing (such as labs, EKG, etc.), we will make arrangements to do so.     Although advances in technology are sophisticated, we cannot ensure that it will always work on either your end or our end.  If the connection with a video visit is poor, the visit may have to be switched to a telephone visit.  With either a video or telephone visit, we are not always able to ensure that we have a secure connection.     I need to obtain your verbal consent now.   Are you willing to proceed with your visit today?    Dennis Moreno has provided verbal consent on 06/25/2021 for a virtual visit (video or telephone).   Piedad Climes, [place]   Date: 06/25/2021 12:55 PM   Virtual Visit via Video Note   I, Piedad Climes, connected with  Dennis Moreno  ([id number], April 03, 1986) on 06/25/21 at 12:45 PM EDT by a video-enabled telemedicine application and verified that I am speaking with the correct person using two identifiers.  Location: Patient: Virtual Visit Location Patient: Home Provider: Virtual Visit Location Provider: Home Office   I discussed the limitations of evaluation and  management by telemedicine and the availability of in person appointments. The patient expressed understanding and agreed to proceed.    History of Present Illness: Dennis Moreno is a 35 y.o. who identifies as a male who was assigned male at birth, and is being seen today for R calf pain.  Endorses 2 days ago noting pain in posterior calf of R leg. Denies any trauma or injury. Hurts to touch but soothes a bit when resting. Quickly irritates when up and moving around. Denies any leg swelling. Denies numbness, tingling or weakness. Denies redness or warmth of skin. Denies having history of this before. Has history of rhabdomyolysis but this does not feel the same. Is not exercising often due to that history. Has not taken anything OTC for symptoms.   HPI: HPI  Problems:  Patient Active Problem List   Diagnosis Date Noted   Rhabdomyolysis 11/20/2020   Low back pain 04/18/2020   Depression, major, single episode, moderate (HCC) 10/10/2017   Anxiety 10/10/2017    Allergies:  Allergies  Allergen Reactions   Hydrocodone Itching   Medications:  Current Outpatient Medications:    meloxicam (MOBIC) 15 MG tablet, Take 1 tablet (15 mg total) by mouth daily., Disp: 20 tablet, Rfl: 0   BEE POLLEN PO, Take 5 mLs by mouth daily., Disp: , Rfl:    Multiple Vitamin (MULTIVITAMIN PO), Take 1 capsule  by mouth in the morning and at bedtime. [address] [place], [place], Disp: , Rfl:    Omega-3 Fatty Acids (FISH OIL) 1000 MG CAPS, Take 2 capsules by mouth daily., Disp: , Rfl:    Probiotic Product (PROBIOTIC PO), Take 2 capsules by mouth daily., Disp: , Rfl:   Observations/Objective: Patient is well-developed, well-nourished in no acute distress.  Resting comfortably  at home.  Head is normocephalic, atraumatic.  No labored breathing.  Speech is clear and coherent with logical content.  Patient is alert and oriented at baseline.  Is ambulating throughout entirety of visit without  issue  Assessment and Plan: 1. Right calf pain - meloxicam (MOBIC) 15 MG tablet; Take 1 tablet (15 mg total) by mouth daily.  Dispense: 20 tablet; Refill: 0 In absence of trauma. No redness or swelling to indicate infection or concern for clotting. 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 RICE discussed. Rx Meloxicam once daily Strict in person follow-up discussed. ER precautions reviewed.   Follow Up Instructions: I discussed the assessment and treatment plan with the patient. The patient was provided an opportunity to ask questions and all were answered. The patient agreed with the plan and demonstrated an understanding of the instructions.  A copy of instructions were sent to the patient via MyChart unless otherwise noted below.   The patient was advised to call back or seek an in-person evaluation if the symptoms worsen or if the condition fails to improve as anticipated.  Time:  I spent 10 minutes with the patient via telehealth technology discussing the above problems/concerns.    Piedad Climes, PA-C

## 2021-06-25 NOTE — Patient Instructions (Signed)
  Dennis Moreno, thank you for joining Piedad Climes, PA-C for today's virtual visit.  While this provider is not your primary care provider (PCP), if your PCP is located in our provider database this encounter information will be shared with them immediately following your visit.  Consent: (Patient) Dennis Moreno provided verbal consent for this virtual visit at the beginning of the encounter.  Current Medications:  Current Outpatient Medications:    BEE POLLEN PO, Take 5 mLs by mouth daily., Disp: , Rfl:    Multiple Vitamin (MULTIVITAMIN PO), Take 1 capsule by mouth in the morning and at bedtime. [address], [place], [place], Disp: , Rfl:    Omega-3 Fatty Acids (FISH OIL) 1000 MG CAPS, Take 2 capsules by mouth daily., Disp: , Rfl:    Probiotic Product (PROBIOTIC PO), Take 2 capsules by mouth daily., Disp: , Rfl:    Medications ordered in this encounter:  No orders of the defined types were placed in this encounter.    *If you need refills on other medications prior to your next appointment, please contact your pharmacy*  Follow-Up: Call back or seek an in-person evaluation if the symptoms worsen or if the condition fails to improve as anticipated.  Other Instructions Elevate leg while resting Apply ice packs for 10 minutes to the area, a few times per day. Take the Meloxicam once daily with food. Wear a compression sleeve or ACE wrap over the area.  If not improving, if anything worsens or new symptoms develop, you need to be seen in person. IF any severe pain -- ER   If you have been instructed to have an in-person evaluation today at a local Urgent Care facility, please use the link below. It will take you to a list of all of our available [facility], including address, phone number and hours of operation. Please do not delay care.  [facility]  If you or a family member do not have a primary care provider, use the link below to  schedule a visit and establish care. When you choose a [facility] primary care physician or advanced practice provider, you gain a long-term partner in health. Find a Primary Care Provider  Learn more about [facility] in-office and virtual care options: [facility] - Get Care Now

## 2021-07-01 ENCOUNTER — Other Ambulatory Visit: Payer: Self-pay

## 2021-07-01 ENCOUNTER — Emergency Department (HOSPITAL_BASED_OUTPATIENT_CLINIC_OR_DEPARTMENT_OTHER)
Admit: 2021-07-01 | Discharge: 2021-07-01 | Disposition: A | Discharge status: Home / Self Care | Payer: Federal, State, Local not specified - PPO | Attending: Emergency Medicine | Admitting: Emergency Medicine

## 2021-07-01 ENCOUNTER — Emergency Department (HOSPITAL_COMMUNITY)
Admission: EM | Admit: 2021-07-01 | Discharge: 2021-07-01 | Disposition: A | Discharge status: Home / Self Care | Payer: Federal, State, Local not specified - PPO | Attending: Emergency Medicine | Admitting: Emergency Medicine

## 2021-07-01 ENCOUNTER — Ambulatory Visit (INDEPENDENT_AMBULATORY_CARE_PROVIDER_SITE_OTHER): Payer: Federal, State, Local not specified - PPO | Admitting: Licensed Clinical Social Worker

## 2021-07-01 ENCOUNTER — Other Ambulatory Visit (HOSPITAL_COMMUNITY): Payer: Self-pay

## 2021-07-01 ENCOUNTER — Encounter (HOSPITAL_COMMUNITY): Payer: Self-pay | Admitting: Oncology

## 2021-07-01 DIAGNOSIS — I82431 Acute embolism and thrombosis of right popliteal vein: Secondary | ICD-10-CM | POA: Insufficient documentation

## 2021-07-01 DIAGNOSIS — Z87891 Personal history of nicotine dependence: Secondary | ICD-10-CM | POA: Diagnosis not present

## 2021-07-01 DIAGNOSIS — M79604 Pain in right leg: Secondary | ICD-10-CM | POA: Diagnosis not present

## 2021-07-01 DIAGNOSIS — F1010 Alcohol abuse, uncomplicated: Secondary | ICD-10-CM

## 2021-07-01 DIAGNOSIS — F1994 Other psychoactive substance use, unspecified with psychoactive substance-induced mood disorder: Secondary | ICD-10-CM

## 2021-07-01 LAB — BASIC METABOLIC PANEL
Anion gap: 8 (ref 5–15)
BUN: 20 mg/dL (ref 6–20)
CO2: 27 mmol/L (ref 22–32)
Calcium: 9.3 mg/dL (ref 8.9–10.3)
Chloride: 101 mmol/L (ref 98–111)
Creatinine, Ser: 0.84 mg/dL (ref 0.61–1.24)
GFR, Estimated: >60 mL/min (ref >60)
Glucose, Bld: 88 mg/dL (ref 70–99)
Potassium: 4.1 mmol/L (ref 3.5–5.1)
Sodium: 136 mmol/L (ref 135–145)

## 2021-07-01 LAB — CBC
HCT: 46.8 % (ref 39.0–52.0)
Hemoglobin: 16 g/dL (ref 13.0–17.0)
MCH: 31.5 pg (ref 26.0–34.0)
MCHC: 34.2 g/dL (ref 30.0–36.0)
MCV: 92.1 fL (ref 80.0–100.0)
Platelets: 328 10*3/uL (ref 150–400)
RBC: 5.08 MIL/uL (ref 4.22–5.81)
RDW: 12.1 % (ref 11.5–15.5)
WBC: 6.2 10*3/uL (ref 4.0–10.5)
nRBC: 0 % (ref 0.0–0.2)

## 2021-07-01 MED ORDER — APIXABAN 5 MG PO TABS: ORAL_TABLET | ORAL | 0 refills | Status: DC

## 2021-07-01 MED ORDER — APIXABAN 5 MG PO TABS: 5.0000 mg | ORAL_TABLET | Freq: Two times a day (BID) | ORAL | Status: DC

## 2021-07-01 MED ORDER — APIXABAN 5 MG PO TABS
10.0000 mg | ORAL_TABLET | Freq: Two times a day (BID) | ORAL | Status: DC
Start: 2021-07-01 — End: 2021-07-01
  Administered 2021-07-01: 10 mg via ORAL
  Filled 2021-07-01 (×2): qty 2

## 2021-07-01 NOTE — ED Provider Notes (Signed)
[facility]-EMERGENCY DEPT Provider Note   CSN: [id number] Arrival date & time: 07/01/21  1020     History Chief Complaint  Patient presents with   Leg Pain    Dennis Moreno is a 35 y.o. male.   Leg Pain  Patient presents ED with complaints of right leg pain.  Patient states he has had an aching pain in his right calf.  He feels it up and down the lower leg.  He denies any recent injuries.  He has not noticed any redness.  No swelling.  Patient states he has a history of rhabdomyolysis associated with strenuous activity in the past.  He has not done any weightlifting or strenuous activity recently.  He denies any chest pain or shortness of breath.  Past Medical History:  Diagnosis Date   Depression     Patient Active Problem List   Diagnosis Date Noted   Rhabdomyolysis 11/20/2020   Low back pain 04/18/2020   Depression, major, single episode, moderate (HCC) 10/10/2017   Anxiety 10/10/2017    Past Surgical History:  Procedure Laterality Date   WISDOM TOOTH EXTRACTION Bilateral 2001       Family History  Problem Relation Age of Onset   Arthritis Mother    Depression Mother    Alcohol abuse Father    Depression Father    Depression Sister    Depression Brother    Alcohol abuse Maternal Grandfather    Alcohol abuse Paternal Grandfather    Depression Paternal Grandfather    Depression Brother     Social History   Tobacco Use   Smoking status: Former    Types: Cigarettes   Smokeless tobacco: Never  Vaping Use   Vaping Use: Never used  Substance Use Topics   Alcohol use: Not Currently   Drug use: Not Currently    Types: Marijuana    Home Medications Prior to Admission medications   Medication Sig Start Date End Date Taking? Authorizing Provider  apixaban (ELIQUIS) 5 MG TABS tablet Take 2 tablets (10mg ) twice daily for 7 days, then 1 tablet (5mg ) twice daily 07/01/21  Yes , MD  BEE POLLEN PO Take 5 mLs by mouth daily.     [provider]  meloxicam (MOBIC) 15 MG tablet Take 1 tablet (15 mg total) by mouth daily. 06/25/21   Linwood Dibbles, PA-C  Multiple Vitamin (MULTIVITAMIN PO) Take 1 capsule by mouth in the morning and at bedtime. 06/27/21, [place], [place]    [provider]  Omega-3 Fatty Acids (FISH OIL) 1000 MG CAPS Take 2 capsules by mouth daily.    [provider]  Probiotic Product (PROBIOTIC PO) Take 2 capsules by mouth daily.    [provider]    Allergies    Hydrocodone  Review of Systems   Review of Systems  All other systems reviewed and are negative.  Physical Exam Updated Vital Signs BP 108/80 (BP Location: Left Arm)   Pulse 77   Temp 98 F (36.7 C) (Oral)   Resp 16   Ht 1.753 m (5\' 9" )   Wt 68 kg   SpO2 98%   BMI 22.15 kg/m   Physical Exam Vitals and nursing note reviewed.  Constitutional:      General: He is not in acute distress.    Appearance: He is well-developed.  HENT:     Head: Normocephalic and atraumatic.     Right Ear: External ear normal.  Left Ear: External ear normal.  Eyes:     General: No scleral icterus.       Right eye: No discharge.        Left eye: No discharge.     Conjunctiva/sclera: Conjunctivae normal.  Neck:     Trachea: No tracheal deviation.  Cardiovascular:     Rate and Rhythm: Normal rate.  Pulmonary:     Effort: Pulmonary effort is normal. No respiratory distress.     Breath sounds: No stridor.  Abdominal:     General: There is no distension.  Musculoskeletal:        General: Tenderness present. No swelling or deformity.     Cervical back: Neck supple.     Right upper leg: Normal.     Right knee: Normal.     Right lower leg: Tenderness present. No swelling. No edema.     Right foot: Normal.     Comments: Tenderness palpation posterior right calf,  Skin:    General: Skin is warm and dry.     Findings: No rash.  Neurological:     Mental Status: He is alert.     Cranial  Nerves: Cranial nerve deficit: no gross deficits.    ED Results / Procedures / Treatments   Labs (all labs ordered are listed, but only abnormal results are displayed) Labs Reviewed  CBC  BASIC METABOLIC PANEL    EKG None  Radiology VAS [place] LOWER EXTREMITY VENOUS (DVT) (7a-7p)  Result Date: 07/01/2021  Lower Venous DVT Study Patient Name:  Dennis Moreno  Date of Exam:   07/01/2021 Medical Rec #: [id number]          Accession #:    [id number] Date of Birth: December 20, 1985          Patient Gender: M Patient Age:   77 years Exam Location:  [facility] Procedure:      VAS [place] LOWER EXTREMITY VENOUS (DVT) Referring Phys: Ervan Heber --------------------------------------------------------------------------------  Indications: Pain.  Risk Factors: None identified. Comparison Study: No prior studies. Performing Technologist: Chanda Busing RVT  Examination Guidelines: A complete evaluation includes B-mode imaging, spectral Doppler, color Doppler, and power Doppler as needed of all accessible portions of each vessel. Bilateral testing is considered an integral part of a complete examination. Limited examinations for reoccurring indications may be performed as noted. The reflux portion of the exam is performed with the patient in reverse Trendelenburg.  +---------+---------------+---------+-----------+----------+--------------+ RIGHT    CompressibilityPhasicitySpontaneityPropertiesThrombus Aging +---------+---------------+---------+-----------+----------+--------------+ CFV      Full           Yes      Yes                                 +---------+---------------+---------+-----------+----------+--------------+ SFJ      Full                                                        +---------+---------------+---------+-----------+----------+--------------+ FV Prox  Full                                                         +---------+---------------+---------+-----------+----------+--------------+  FV Mid   Full                                                        +---------+---------------+---------+-----------+----------+--------------+ FV DistalFull                                                        +---------+---------------+---------+-----------+----------+--------------+ PFV      Full                                                        +---------+---------------+---------+-----------+----------+--------------+ POP      Partial        Yes      Yes                  Acute          +---------+---------------+---------+-----------+----------+--------------+ PTV      None                                         Acute          +---------+---------------+---------+-----------+----------+--------------+ PERO     Full                                                        +---------+---------------+---------+-----------+----------+--------------+ Gastroc  Partial                                      Acute          +---------+---------------+---------+-----------+----------+--------------+   +----+---------------+---------+-----------+----------+--------------+ LEFTCompressibilityPhasicitySpontaneityPropertiesThrombus Aging +----+---------------+---------+-----------+----------+--------------+ CFV Full           Yes      Yes                                 +----+---------------+---------+-----------+----------+--------------+    Summary: RIGHT: - Findings consistent with acute deep vein thrombosis involving the right popliteal vein, right posterior tibial veins, and right gastrocnemius veins. - No cystic structure found in the popliteal fossa.  LEFT: - No evidence of common femoral vein obstruction.  *See table(s) above for measurements and observations.    Preliminary     Procedures Procedures   Medications Ordered in ED Medications  apixaban (ELIQUIS) tablet 10  mg (has no administration in time range)    Followed by  apixaban (ELIQUIS) tablet 5 mg (has no administration in time range)    ED Course  I have reviewed the triage vital signs and the nursing notes.  Pertinent labs & imaging results that were available during my care of the patient were reviewed by me and considered in my  medical decision making (see chart for details).  Clinical Course as of 07/01/21 1336  Wed Jul 01, 2021  1159 [place] positive for DVT [JK]    Clinical Course User Index [JK] Linwood Dibbles, MD   MDM Rules/Calculators/A&P                           Patient presented with atraumatic right calf pain.  Symptoms concerning for the possibility of DVT.  Exam was not suggestive of infection.  No signs of acute arterial compromise.  Patient does not have any chest pain or shortness of breath to suggest PE.  Doppler study confirms an acute popliteal DVT.  We will plan on starting the patient on a NOAC.  We will check baseline renal function and CBC.  Lab tests are normal.  Will discharge home with rx  for Eliquis Final Clinical Impression(s) / ED Diagnoses Final diagnoses:  Acute deep vein thrombosis (DVT) of right popliteal vein (HCC)    Rx / DC Orders ED Discharge Orders          Ordered    apixaban (ELIQUIS) 5 MG TABS tablet        07/01/21 1335             Linwood Dibbles, MD 07/01/21 1336

## 2021-07-01 NOTE — Progress Notes (Signed)
Virtual Visit via Telephone Note  I connected with Dennis Moreno on 07/01/21 at  8:00 AM EDT by telephone and verified that I am speaking with the correct person using two identifiers.  Location: Patient: fathers house Provider: office   I discussed the limitations, risks, security and privacy concerns of performing an evaluation and management service by telephone and the availability of in person appointments. I also discussed with the patient that there may be a patient responsible charge related to this service. The patient expressed understanding and agreed to proceed.   I discussed the assessment and treatment plan with the patient. The patient was provided an opportunity to ask questions and all were answered. The patient agreed with the plan and demonstrated an understanding of the instructions.   The patient was advised to call back or seek an in-person evaluation if the symptoms worsen or if the condition fails to improve as anticipated.  I provided 45 minutes of non-face-to-face time during this encounter.   [name], LCSW    THERAPIST PROGRESS NOTE  Session Time: [phone]  Participation Level: Active  Behavioral Response: NA and phone visit UTA, AlertDysphoric  Type of Therapy: Individual Therapy  Treatment Goals addressed: Coping and Diagnosis: achieve/maintain sobriety from mood altering substances 7/7 days weekly.  Interventions: CBT, Motivational Interviewing, and Supportive  Summary: Dennis Moreno is a 35 y.o. male who presents with alcohol use, depressive symptoms. Client processed recent changes in legal, family, and work stressors. Client identified differences in staying with father or at home currently while maintaining sobriety, reporting he is in a "different head space."   Suicidal/Homicidal: No  Therapist Response: Clinician checked in, assessed for SI/HI/psychosis and overall level of functioning. Clinician continued processing with  client effects of ACOA and behavior traits common between siblings. Clinician and client processed relationship patterns in recovery and sobriety.  Plan: Return again in 2-3 weeks.  Diagnosis: Axis I: Alcohol Abuse       [name], LCSW 07/01/2021

## 2021-07-01 NOTE — Progress Notes (Signed)
Right lower extremity venous duplex has been completed. Preliminary results can be found in CV Proc through chart review.  Results were given to Dr. Lynelle Doctor.  07/01/21 12:11 PM Olen Cordial RVT

## 2021-07-01 NOTE — TOC Benefit Eligibility Note (Signed)
Patient Product/process development scientist completed.    The patient is currently admitted and upon discharge could be taking Eliquis 5 mg.  The current 30 day co-pay is, $55.00.   The patient is currently admitted and upon discharge could be taking Xarelto 20 mg.  The current 30 day co-pay is, $55.00.   The patient is insured through [employer]    Dennis Moreno, CPhT Pharmacy Patient Advocate Specialist [facility] Antimicrobial Stewardship Team Direct Number: [phone]  Fax: [phone]

## 2021-07-01 NOTE — Discharge Instructions (Addendum)
Follow-up with your primary care physician to further evaluate the cause of your deep venous thrombosis.  You will need a refill of your Eliquis prescription.   Usual treatment is for at least a few months.

## 2021-07-01 NOTE — ED Triage Notes (Signed)
Pt c/o right leg pain x 2 weeks.  Pain has gotten progressively worse.  Denies injury.

## 2021-07-08 ENCOUNTER — Ambulatory Visit (HOSPITAL_COMMUNITY): Payer: Federal, State, Local not specified - PPO | Admitting: Licensed Clinical Social Worker

## 2021-07-10 ENCOUNTER — Other Ambulatory Visit: Payer: Self-pay

## 2021-07-10 ENCOUNTER — Ambulatory Visit (INDEPENDENT_AMBULATORY_CARE_PROVIDER_SITE_OTHER): Payer: Federal, State, Local not specified - PPO | Admitting: Licensed Clinical Social Worker

## 2021-07-10 DIAGNOSIS — F1994 Other psychoactive substance use, unspecified with psychoactive substance-induced mood disorder: Secondary | ICD-10-CM | POA: Diagnosis not present

## 2021-07-10 DIAGNOSIS — F1010 Alcohol abuse, uncomplicated: Secondary | ICD-10-CM

## 2021-07-10 DIAGNOSIS — F3340 Major depressive disorder, recurrent, in remission, unspecified: Secondary | ICD-10-CM

## 2021-07-10 NOTE — Progress Notes (Signed)
Virtual Visit via Telephone Note  I connected with Dennis Moreno on 07/10/21 at 10:00 AM EDT by telephone and verified that I am speaking with the correct person using two identifiers.  Location: Patient: home Provider: office   I discussed the limitations, risks, security and privacy concerns of performing an evaluation and management service by telephone and the availability of in person appointments. I also discussed with the patient that there may be a patient responsible charge related to this service. The patient expressed understanding and agreed to proceed.   I discussed the assessment and treatment plan with the patient. The patient was provided an opportunity to ask questions and all were answered. The patient agreed with the plan and demonstrated an understanding of the instructions.   The patient was advised to call back or seek an in-person evaluation if the symptoms worsen or if the condition fails to improve as anticipated.  I provided 45 minutes of non-face-to-face time during this encounter.   [name], LCSW    THERAPIST PROGRESS NOTE  Session Time: 1005-1050  Participation Level: Active  Behavioral Response: NAAlertDysphoric  Type of Therapy: Individual Therapy  Treatment Goals addressed: Anxiety, Coping, and Diagnosis: decrease use of mind altering substances to 0/7 days weekly.  Interventions: CBT and Supportive  Summary: Dennis Moreno is a 35 y.o. male who presents with alcohol use, unspecified depression (MDD vs substance induced depression.)  Client showed progress toward goal AEB no substance use since previous session (0/7 days weekly.) Client was receptive to psycho-education and provided examples of how he related to laundry list traits. Client processed conversation with sister about interactions with their mother as both children and adults.  Suicidal/Homicidal: No  Therapist Response:  Clinician reviewed ACOA character traits and  laundry list. Clinician and client discussed protective skills from childhood which do not transition to healthy relationships in adulthood. Clinician processed with client relationship dynamics modeled growing up and had client reflect on recent relationship dynamics with family members and romantic partners. Clinician processed with client internal vs external validation and behaviors based on the need for instant gratification.  Plan: Return again in 1-2 weeks.  Diagnosis: Axis I: Alcohol Abuse and Depressive Disorder NOS       [name], LCSW 07/10/2021

## 2021-07-16 ENCOUNTER — Other Ambulatory Visit: Payer: Self-pay

## 2021-07-16 ENCOUNTER — Ambulatory Visit (INDEPENDENT_AMBULATORY_CARE_PROVIDER_SITE_OTHER): Payer: Federal, State, Local not specified - PPO | Admitting: Licensed Clinical Social Worker

## 2021-07-16 DIAGNOSIS — F1994 Other psychoactive substance use, unspecified with psychoactive substance-induced mood disorder: Secondary | ICD-10-CM

## 2021-07-16 DIAGNOSIS — F1010 Alcohol abuse, uncomplicated: Secondary | ICD-10-CM | POA: Diagnosis not present

## 2021-07-16 NOTE — Progress Notes (Signed)
Virtual Visit via Telephone Note  I connected with Dennis Moreno on 07/16/21 at  8:00 AM EDT by telephone and verified that I am speaking with the correct person using two identifiers.  Location: Patient: home Provider: office   I discussed the limitations, risks, security and privacy concerns of performing an evaluation and management service by telephone and the availability of in person appointments. I also discussed with the patient that there may be a patient responsible charge related to this service. The patient expressed understanding and agreed to proceed.  I discussed the assessment and treatment plan with the patient. The patient was provided an opportunity to ask questions and all were answered. The patient agreed with the plan and demonstrated an understanding of the instructions.   The patient was advised to call back or seek an in-person evaluation if the symptoms worsen or if the condition fails to improve as anticipated.  I provided 45 minutes of non-face-to-face time during this encounter.   [name], LCSW    THERAPIST PROGRESS NOTE  Session Time: [phone]  Participation Level: Active  Behavioral Response: NAAlertDysphoric  Type of Therapy: Individual Therapy  Treatment Goals addressed: Anxiety, Coping, and Diagnosis: unspecified depressive disorder (MDD vs substance induced mood disorder)  Interventions: CBT and Supportive  Summary: NADIR VASQUES is a 35 y.o. male who presents with alcohol abuse, depressive symptoms. Client showed progress toward goal AEB no use of ETOH or THC since previous session. Client needs continued support building sober support system to avoid isolation when avoiding "negative" peers. Client worked to link previous "safety" behaviors from growing up with unhealthy relationship patterns as an adult.  Suicidal/Homicidal: No  Therapist Response: LCSW checked in with client, assessed for SI/HI/psychosis and overall level of  functioning including substance use. Clinician and client reviewed current thought patterns and self talk against previously reviewed ACOA information received last session. Clinician challenged client to identify specific self talk and reviewed "catch-challenge-change" method of addressing distorted thinking.  Plan: Return again in 1-2 weeks.  Diagnosis: Axis I: Alcohol Abuse and Depressive Disorder NOS       [name], LCSW 07/16/2021

## 2021-07-21 ENCOUNTER — Ambulatory Visit (HOSPITAL_COMMUNITY)
Admission: EM | Admit: 2021-07-21 | Discharge: 2021-07-21 | Disposition: A | Discharge status: Home / Self Care | Payer: Federal, State, Local not specified - PPO

## 2021-07-21 ENCOUNTER — Other Ambulatory Visit: Payer: Self-pay

## 2021-07-21 DIAGNOSIS — M79604 Pain in right leg: Secondary | ICD-10-CM | POA: Diagnosis not present

## 2021-07-21 NOTE — ED Triage Notes (Signed)
Pt presents with leg pain X 1 month. States he was put on blood thinners and states today he has had a lot of leg pain.

## 2021-07-21 NOTE — ED Provider Notes (Signed)
[place]    CSN: [id number] Arrival date & time: 07/21/21  1816      History   Chief Complaint Chief Complaint  Patient presents with   Leg Pain    HPI Dennis Moreno is a 35 y.o. male.   Pt diagnosed with DVT and started on Eliquis several weeks ago.  He reports mild pain to right lower extremity today. Denies swelling, redness, warmth, chest pain, shortness of breath.  He reports the PCP he was referred to from the ED is not accepting patients at this time.     Past Medical History:  Diagnosis Date   Depression     Patient Active Problem List   Diagnosis Date Noted   Rhabdomyolysis 11/20/2020   Low back pain 04/18/2020   Depression, major, single episode, moderate ([place]) 10/10/2017   Anxiety 10/10/2017    Past Surgical History:  Procedure Laterality Date   WISDOM TOOTH EXTRACTION Bilateral 2001       Home Medications    Prior to Admission medications   Medication Sig Start Date End Date Taking? Authorizing Provider  apixaban (ELIQUIS) 5 MG TABS tablet Take 2 tablets (10mg ) twice daily for 7 days, then 1 tablet (5mg ) twice daily 07/01/21   Dorie Rank, MD  BEE POLLEN PO Take 5 mLs by mouth daily.    [provider]  meloxicam (MOBIC) 15 MG tablet Take 1 tablet (15 mg total) by mouth daily. 06/25/21   Brunetta Jeans, PA-C  Multiple Vitamin (MULTIVITAMIN PO) Take 1 capsule by mouth in the morning and at bedtime. Jani Gravel, Bladderack, Burdock Root    [provider]  Omega-3 Fatty Acids (FISH OIL) 1000 MG CAPS Take 2 capsules by mouth daily.    [provider]  Probiotic Product (PROBIOTIC PO) Take 2 capsules by mouth daily.    [provider]    Family History Family History  Problem Relation Age of Onset   Arthritis Mother    Depression Mother    Alcohol abuse Father    Depression Father    Depression Sister    Depression Brother    Alcohol abuse Maternal Grandfather    Alcohol abuse Paternal  Grandfather    Depression Paternal Grandfather    Depression Brother     Social History Social History   Tobacco Use   Smoking status: Former    Types: Cigarettes   Smokeless tobacco: Never  Vaping Use   Vaping Use: Never used  Substance Use Topics   Alcohol use: Not Currently   Drug use: Not Currently    Types: Marijuana     Allergies   Hydrocodone   Review of Systems Review of Systems  Constitutional:  Negative for chills and fever.  HENT:  Negative for ear pain and sore throat.   Eyes:  Negative for pain and visual disturbance.  Respiratory:  Negative for cough and shortness of breath.   Cardiovascular:  Negative for chest pain and palpitations.  Gastrointestinal:  Negative for abdominal pain and vomiting.  Genitourinary:  Negative for dysuria and hematuria.  Musculoskeletal:  Positive for myalgias. Negative for arthralgias and back pain.  Skin:  Negative for color change and rash.  Neurological:  Negative for seizures and syncope.  All other systems reviewed and are negative.   Physical Exam Triage Vital Signs ED Triage Vitals  Enc Vitals Group     BP 07/21/21 1924 116/69     Pulse Rate 07/21/21 1923 68  Resp 07/21/21 1923 17     Temp 07/21/21 1923 98.7 F (37.1 C)     Temp Source 07/21/21 1923 Oral     SpO2 07/21/21 1923 100 %     Weight --      Height --      Head Circumference --      Peak Flow --      Pain Score 07/21/21 1922 4     Pain Loc --      Pain Edu? --      Excl. in GC? --    No data found.  Updated Vital Signs BP 116/69   Pulse 68   Temp 98.7 F (37.1 C) (Oral)   Resp 17   SpO2 100%   Visual Acuity Right Eye Distance:   Left Eye Distance:   Bilateral Distance:    Right Eye Near:   Left Eye Near:    Bilateral Near:     Physical Exam Vitals and nursing note reviewed.  Constitutional:      Appearance: He is well-developed.  HENT:     Head: Normocephalic and atraumatic.  Eyes:     Conjunctiva/sclera: Conjunctivae  normal.  Cardiovascular:     Rate and Rhythm: Normal rate and regular rhythm.     Heart sounds: No murmur heard. Pulmonary:     Effort: Pulmonary effort is normal. No respiratory distress.     Breath sounds: Normal breath sounds.  Abdominal:     Palpations: Abdomen is soft.     Tenderness: There is no abdominal tenderness.  Musculoskeletal:     Cervical back: Neck supple.     Comments: No right leg swelling, redness, warmth, or tenderness to palpation.   Skin:    General: Skin is warm and dry.  Neurological:     Mental Status: He is alert.     UC Treatments / Results  Labs (all labs ordered are listed, but only abnormal results are displayed) Labs Reviewed - No data to display  EKG   Radiology No results found.  Procedures Procedures (including critical care time)  Medications Ordered in UC Medications - No data to display  Initial Impression / Assessment and Plan / UC Course  I have reviewed the triage vital signs and the nursing notes.  Pertinent labs & imaging results that were available during my care of the patient were reviewed by me and considered in my medical decision making (see chart for details).     Pt with diagnosed DVT, currently on Eliquis, taking as prescribed.  No tenderness, swelling, redness, or warmth to right lower extremity today.  Pt denies chest pain or shortness of breath. Stable for discharge.  Advised to continue Eliquis, discussed the importance.  Information for PCP given.  Return precautions discussed.  Final Clinical Impressions(s) / UC Diagnoses   Final diagnoses:  Right leg pain     Discharge Instructions      Continue Eliquis, continued for 3 months  Follow up with Primary Care    ED Prescriptions   None    PDMP not reviewed this encounter.   Ward, Tylene Fantasia, PA-C 07/21/21 2007

## 2021-07-21 NOTE — Discharge Instructions (Signed)
Continue Eliquis, continued for 3 months  Follow up with Primary Care

## 2021-07-23 ENCOUNTER — Other Ambulatory Visit: Payer: Self-pay

## 2021-07-23 ENCOUNTER — Ambulatory Visit (HOSPITAL_COMMUNITY): Payer: Federal, State, Local not specified - PPO | Admitting: Licensed Clinical Social Worker

## 2021-07-24 ENCOUNTER — Other Ambulatory Visit: Payer: Self-pay

## 2021-07-24 ENCOUNTER — Ambulatory Visit (INDEPENDENT_AMBULATORY_CARE_PROVIDER_SITE_OTHER): Payer: Federal, State, Local not specified - PPO | Admitting: Licensed Clinical Social Worker

## 2021-07-24 DIAGNOSIS — F1994 Other psychoactive substance use, unspecified with psychoactive substance-induced mood disorder: Secondary | ICD-10-CM

## 2021-07-24 DIAGNOSIS — F1010 Alcohol abuse, uncomplicated: Secondary | ICD-10-CM

## 2021-07-24 NOTE — Progress Notes (Signed)
Virtual Visit via Telephone Note  I connected with Dennis Moreno on 07/24/21 at  8:00 AM EST by telephone and verified that I am speaking with the correct person using two identifiers.  Location: Patient: home Provider: office   I discussed the limitations, risks, security and privacy concerns of performing an evaluation and management service by telephone and the availability of in person appointments. I also discussed with the patient that there may be a patient responsible charge related to this service. The patient expressed understanding and agreed to proceed.  I discussed the assessment and treatment plan with the patient. The patient was provided an opportunity to ask questions and all were answered. The patient agreed with the plan and demonstrated an understanding of the instructions.   The patient was advised to call back or seek an in-person evaluation if the symptoms worsen or if the condition fails to improve as anticipated.  I provided 45 minutes of non-face-to-face time during this encounter.   [name], LCSW    THERAPIST PROGRESS NOTE  Session Time: [phone]  Participation Level: Active  Behavioral Response: NAAlertDysphoric  Type of Therapy: Individual Therapy  Treatment Goals addressed: Coping  Interventions: CBT and Supportive  Summary: RAD GRAMLING is a 35 y.o. male who presents with alcohol use, cannabis use, unspecified depressive d/o R/O substance induced mood disorder. Client reported some improvement in motivation but continued struggling identifying healthy supports vs risk of isolation. Client not receptive to encouragement to engage in AA or NA as an option to develop new, healthy relationships.  Suicidal/Homicidal: Nowithout intent/plan  Therapist Response: Clinician checked in with client, assessed for SI/HI/psychosis and overall level of functioning Clinician worked with client to continue assessment of healthy boundaries with peers  and family members. Clinician and client processed options for boundary enforcement. Clinician presented the use of socratic questioning when "I get stuck in my head trying to figure out the truth of what's right."  Plan: Return again in 2 weeks.  Diagnosis: Axis I: Substance Abuse and Substance Induced Mood Disorder       [name], LCSW 07/24/2021

## 2021-08-04 ENCOUNTER — Ambulatory Visit (HOSPITAL_COMMUNITY): Payer: Federal, State, Local not specified - PPO | Admitting: Licensed Clinical Social Worker

## 2021-08-10 ENCOUNTER — Encounter (HOSPITAL_COMMUNITY): Payer: Self-pay

## 2021-08-10 ENCOUNTER — Emergency Department (HOSPITAL_COMMUNITY)
Admission: EM | Admit: 2021-08-10 | Discharge: 2021-08-11 | Disposition: A | Discharge status: Home / Self Care | Payer: Federal, State, Local not specified - PPO | Attending: Emergency Medicine | Admitting: Emergency Medicine

## 2021-08-10 ENCOUNTER — Other Ambulatory Visit: Payer: Self-pay

## 2021-08-10 ENCOUNTER — Telehealth: Payer: Federal, State, Local not specified - PPO | Admitting: Family Medicine

## 2021-08-10 DIAGNOSIS — M79604 Pain in right leg: Secondary | ICD-10-CM | POA: Diagnosis not present

## 2021-08-10 DIAGNOSIS — Z5321 Procedure and treatment not carried out due to patient leaving prior to being seen by health care provider: Secondary | ICD-10-CM | POA: Diagnosis not present

## 2021-08-10 DIAGNOSIS — M79673 Pain in unspecified foot: Secondary | ICD-10-CM

## 2021-08-10 DIAGNOSIS — M79671 Pain in right foot: Secondary | ICD-10-CM | POA: Insufficient documentation

## 2021-08-10 NOTE — ED Triage Notes (Signed)
Pt states that he had a blood clot in his right leg a couple of months ago and is now having the same pain in his right foot and leg again. Pt was prescribed a blood thinner but was unable to take it as needed because he can't afford it.

## 2021-08-10 NOTE — Progress Notes (Signed)
Dennis Moreno - needs to have assessment and doppler of leg and foot for blood clot. Advised to go to ED.  Has ben spacing out his blood thinners due to cost- needs coupon and or another option is able. As well as PCP follow up- reports will be going in Dec to new pt appt.

## 2021-08-11 NOTE — ED Notes (Signed)
I called patient for a room and no one responded 

## 2021-08-12 ENCOUNTER — Emergency Department (HOSPITAL_BASED_OUTPATIENT_CLINIC_OR_DEPARTMENT_OTHER): Payer: Federal, State, Local not specified - PPO

## 2021-08-12 ENCOUNTER — Encounter (HOSPITAL_COMMUNITY): Payer: Self-pay

## 2021-08-12 ENCOUNTER — Emergency Department (HOSPITAL_COMMUNITY)
Admission: EM | Admit: 2021-08-12 | Discharge: 2021-08-12 | Disposition: A | Discharge status: Home / Self Care | Payer: Federal, State, Local not specified - PPO | Attending: Emergency Medicine | Admitting: Emergency Medicine

## 2021-08-12 DIAGNOSIS — I82401 Acute embolism and thrombosis of unspecified deep veins of right lower extremity: Secondary | ICD-10-CM | POA: Diagnosis not present

## 2021-08-12 DIAGNOSIS — Z87891 Personal history of nicotine dependence: Secondary | ICD-10-CM | POA: Insufficient documentation

## 2021-08-12 DIAGNOSIS — Z86718 Personal history of other venous thrombosis and embolism: Secondary | ICD-10-CM

## 2021-08-12 DIAGNOSIS — Z7901 Long term (current) use of anticoagulants: Secondary | ICD-10-CM | POA: Diagnosis not present

## 2021-08-12 DIAGNOSIS — M79604 Pain in right leg: Secondary | ICD-10-CM

## 2021-08-12 DIAGNOSIS — M7989 Other specified soft tissue disorders: Secondary | ICD-10-CM | POA: Diagnosis not present

## 2021-08-12 DIAGNOSIS — M79661 Pain in right lower leg: Secondary | ICD-10-CM | POA: Diagnosis not present

## 2021-08-12 MED ORDER — APIXABAN 5 MG PO TABS: ORAL_TABLET | ORAL | 0 refills | Status: AC

## 2021-08-12 NOTE — ED Notes (Signed)
An After Visit Summary was printed and given to the patient. Discharge instructions given and no further questions at this time.  

## 2021-08-12 NOTE — Discharge Instructions (Addendum)
Please take the blood thinners as prescribed.  Also follow-up with the clinic attached to these discharge papers to decide if you need to continue the blood thinner use longer than a month.

## 2021-08-12 NOTE — ED Provider Notes (Signed)
[facility]-EMERGENCY DEPT Provider Note   CSN: [id number] Arrival date & time: 08/12/21  1607     History Chief Complaint  Patient presents with   Leg Pain    Dennis Moreno is a 35 y.o. male Nicole Kindred with DVT of right lower extremity on 10/19 presenting with a complaint of right lower extremity pain and swelling.  Reports that he took the blood thinners however skipped doses" spread it out a little bit."  Was unable to get more medication.  Did not follow-up with primary care provider.  Felt as though his leg was getting better initially however is now having pressure and discomfort when walking.  Denying any chest pain or shortness of breath.  Past Medical History:  Diagnosis Date   Depression     Patient Active Problem List   Diagnosis Date Noted   Rhabdomyolysis 11/20/2020   Low back pain 04/18/2020   Depression, major, single episode, moderate ([facility]) 10/10/2017   Anxiety 10/10/2017    Past Surgical History:  Procedure Laterality Date   WISDOM TOOTH EXTRACTION Bilateral 2001       Family History  Problem Relation Age of Onset   Arthritis Mother    Depression Mother    Alcohol abuse Father    Depression Father    Depression Sister    Depression Brother    Alcohol abuse Maternal Grandfather    Alcohol abuse Paternal Grandfather    Depression Paternal Grandfather    Depression Brother     Social History   Tobacco Use   Smoking status: Former    Types: Cigarettes   Smokeless tobacco: Never  Vaping Use   Vaping Use: Never used  Substance Use Topics   Alcohol use: Not Currently   Drug use: Not Currently    Types: Marijuana    Home Medications Prior to Admission medications   Medication Sig Start Date End Date Taking? Authorizing Provider  apixaban (ELIQUIS) 5 MG TABS tablet Take 2 tablets (10mg ) twice daily for 7 days, then 1 tablet (5mg ) twice daily 07/01/21   , MD  BEE POLLEN PO Take 5 mLs by mouth daily.    [provider]  meloxicam (MOBIC) 15 MG tablet Take 1 tablet (15 mg total) by mouth daily. 06/25/21   Linwood Dibbles, PA-C  Multiple Vitamin (MULTIVITAMIN PO) Take 1 capsule by mouth in the morning and at bedtime. 06/27/21, Bladderack, Burdock Root    [provider]  Omega-3 Fatty Acids (FISH OIL) 1000 MG CAPS Take 2 capsules by mouth daily.    [provider]  Probiotic Product (PROBIOTIC PO) Take 2 capsules by mouth daily.    [provider]    Allergies    Hydrocodone  Review of Systems   Review of Systems  Respiratory:  Negative for shortness of breath.   Cardiovascular:  Positive for leg swelling. Negative for chest pain.  Musculoskeletal:  Positive for gait problem.  All other systems reviewed and are negative.  Physical Exam Updated Vital Signs BP 115/67 (BP Location: Right Arm)   Pulse 93   Temp 98.2 F (36.8 C) (Oral)   Resp 18   Ht 5\' 8"  (1.727 m)   Wt 70.3 kg   SpO2 99%   BMI 23.57 kg/m   Physical Exam Vitals and nursing note reviewed.  Constitutional:      General: He is not in acute distress.    Appearance: Normal appearance. He is not ill-appearing.  HENT:  Head: Normocephalic and atraumatic.  Eyes:     General: No scleral icterus.    Conjunctiva/sclera: Conjunctivae normal.  Cardiovascular:     Rate and Rhythm: Normal rate and regular rhythm.  Pulmonary:     Effort: Pulmonary effort is normal. No respiratory distress.  Skin:    General: Skin is warm and dry.     Findings: No bruising or rash.  Neurological:     Mental Status: He is alert.  Psychiatric:        Mood and Affect: Mood normal.    ED Results / Procedures / Treatments   Labs (all labs ordered are listed, but only abnormal results are displayed) Labs Reviewed - No data to display  EKG None  Radiology No results found.  Procedures Procedures   Medications Ordered in ED Medications - No data to display  ED Course  I have reviewed the triage  vital signs and the nursing notes.  Pertinent labs & imaging results that were available during my care of the patient were reviewed by me and considered in my medical decision making (see chart for details).  Clinical Course as of 08/12/21 2031  Wed Aug 12, 2021  1924 Per vascular, no new clot in the RLE. Popliteal has resolved, posterior tibial is stable from prior exam.  [RS]    Clinical Course User Index [RS] Sponseller, Eugene Gavia, PA-C   MDM Rules/Calculators/A&P Patient fully evaluated by me.  Minor amount of swelling to right lower extremity.  Strong pulses.  Ultrasound revealed a resolved popliteal DVT.  Continues to have a PT DVT, no smaller than last exam.  Patient to be restarted on blood thinners and given a follow-up.  He understands the importance of following up on this problem to avoid pulmonary embolus.  Final Clinical Impression(s) / ED Diagnoses Final diagnoses:  Acute deep vein thrombosis (DVT) of right lower extremity, unspecified vein ([facility])    Rx / DC Orders Results and diagnoses were explained to the patient. Return precautions discussed in full. Patient had no additional questions and expressed complete understanding.     Woodroe Chen 08/12/21 2047    Charlynne Pander, MD 08/12/21 [phone]

## 2021-08-12 NOTE — ED Triage Notes (Signed)
Pt presents with c/o pain in his right leg and foot. Pt reports he was recently diagnosed with a blood clot approx a month and a half ago and is now having that same pain in his leg. Pt reports he has been taking the medication for the blood clot but had to space it out towards the end as he could not afford it.

## 2021-08-12 NOTE — Progress Notes (Signed)
Right lower extremity venous duplex completed. Refer to "CV Proc" under chart review to view preliminary results.  Preliminary results discussed with Lupe Carney, PA-C.  08/12/2021 7:36 PM Eula Fried., MHA, RVT, RDCS, RDMS

## 2021-08-13 ENCOUNTER — Ambulatory Visit (HOSPITAL_COMMUNITY): Payer: Federal, State, Local not specified - PPO | Admitting: Licensed Clinical Social Worker

## 2021-08-13 ENCOUNTER — Other Ambulatory Visit: Payer: Self-pay

## 2021-08-13 ENCOUNTER — Telehealth (HOSPITAL_COMMUNITY): Payer: Self-pay | Admitting: Licensed Clinical Social Worker

## 2021-08-18 ENCOUNTER — Telehealth (HOSPITAL_COMMUNITY): Payer: Self-pay | Admitting: Psychiatry

## 2021-08-18 ENCOUNTER — Other Ambulatory Visit: Payer: Self-pay

## 2021-08-18 ENCOUNTER — Ambulatory Visit (INDEPENDENT_AMBULATORY_CARE_PROVIDER_SITE_OTHER): Payer: Federal, State, Local not specified - PPO | Admitting: Licensed Clinical Social Worker

## 2021-08-18 DIAGNOSIS — F1010 Alcohol abuse, uncomplicated: Secondary | ICD-10-CM

## 2021-08-18 DIAGNOSIS — F339 Major depressive disorder, recurrent, unspecified: Secondary | ICD-10-CM | POA: Diagnosis not present

## 2021-08-18 NOTE — Progress Notes (Signed)
Virtual Visit via Telephone Note  I connected with Dennis Moreno on 08/18/21 at 11:00 AM EST by telephone and verified that I am speaking with the correct person using two identifiers.  Location: Patient: home Provider: office   I discussed the limitations, risks, security and privacy concerns of performing an evaluation and management service by telephone and the availability of in person appointments. I also discussed with the patient that there may be a patient responsible charge related to this service. The patient expressed understanding and agreed to proceed.  I discussed the assessment and treatment plan with the patient. The patient was provided an opportunity to ask questions and all were answered. The patient agreed with the plan and demonstrated an understanding of the instructions.   The patient was advised to call back or seek an in-person evaluation if the symptoms worsen or if the condition fails to improve as anticipated.  I provided 30 minutes of non-face-to-face time during this encounter.   [name], LCSW    THERAPIST PROGRESS NOTE  Session Time: 1110-1140  Participation Level: Active  Behavioral Response: NAAlertDysphoric  Type of Therapy: Individual Therapy  Treatment Goals addressed: Coping  Interventions: Motivational Interviewing  Summary: Dennis Moreno is a 35 y.o. male who presents with alcohol use disorder, substance induced depression vs major depressive disorder, not improved with sobriety. Client reported increase in depresive symptoms and significantly decreased motivation over the past 1-2 weeks and increased anxiety when at work. Client reported brief attempt at self medication with 2 drinks one time and a few days of cannabis use, reporting is was unhelpful and had poor side effects and client planned to return to sobriety. Client was receptive to psycho-education on substances and motivation and agreed that has been his experience.  Client requesting consider additional level of care or consideration for medication as depressive symptoms have not let us.   Suicidal/Homicidal: No  Therapist Response: Clinician checked in with client, assessed for SI/HI/psychosis and overall level of functioning. Clinician inquired about levels of depression and anxiety. Clinician inquired about any changes in substance use. Clinician provided psycho-education on the effects of substances on dopamine and motivation.  Plan: Referral to MH-IOP. Would be appropriate for MH-IOP AEB ongoing depressive symptoms not improved with outpatient skills or sobriety.  Diagnosis: Axis I: Depressive Disorder NOS and Substance Abuse        [name], LCSW 08/18/2021

## 2021-08-18 NOTE — Telephone Encounter (Signed)
See telephone note. Call completed.

## 2021-08-19 ENCOUNTER — Ambulatory Visit (HOSPITAL_BASED_OUTPATIENT_CLINIC_OR_DEPARTMENT_OTHER): Payer: Federal, State, Local not specified - PPO | Admitting: Family Medicine

## 2021-08-25 ENCOUNTER — Other Ambulatory Visit: Payer: Self-pay

## 2021-08-25 ENCOUNTER — Other Ambulatory Visit (HOSPITAL_COMMUNITY): Payer: Federal, State, Local not specified - PPO | Attending: Psychiatry | Admitting: Psychiatry

## 2021-08-25 ENCOUNTER — Encounter (HOSPITAL_COMMUNITY): Payer: Self-pay | Admitting: Psychiatry

## 2021-08-25 DIAGNOSIS — R4587 Impulsiveness: Secondary | ICD-10-CM | POA: Diagnosis not present

## 2021-08-25 DIAGNOSIS — F1291 Cannabis use, unspecified, in remission: Secondary | ICD-10-CM | POA: Insufficient documentation

## 2021-08-25 DIAGNOSIS — F3340 Major depressive disorder, recurrent, in remission, unspecified: Secondary | ICD-10-CM | POA: Diagnosis not present

## 2021-08-25 DIAGNOSIS — F332 Major depressive disorder, recurrent severe without psychotic features: Secondary | ICD-10-CM | POA: Insufficient documentation

## 2021-08-25 DIAGNOSIS — F418 Other specified anxiety disorders: Secondary | ICD-10-CM | POA: Diagnosis not present

## 2021-08-25 DIAGNOSIS — F419 Anxiety disorder, unspecified: Secondary | ICD-10-CM | POA: Insufficient documentation

## 2021-08-25 DIAGNOSIS — F439 Reaction to severe stress, unspecified: Secondary | ICD-10-CM | POA: Diagnosis not present

## 2021-08-25 DIAGNOSIS — Z638 Other specified problems related to primary support group: Secondary | ICD-10-CM | POA: Diagnosis not present

## 2021-08-25 DIAGNOSIS — F1011 Alcohol abuse, in remission: Secondary | ICD-10-CM | POA: Insufficient documentation

## 2021-08-25 DIAGNOSIS — R454 Irritability and anger: Secondary | ICD-10-CM | POA: Insufficient documentation

## 2021-08-25 DIAGNOSIS — Z604 Social exclusion and rejection: Secondary | ICD-10-CM | POA: Insufficient documentation
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
## 2021-08-25 NOTE — Progress Notes (Signed)
Virtual Visit via Video Note   I connected with Dennis Moreno on 08/25/21 at  9:00 AM EDT by a video enabled telemedicine application and verified that I am speaking with the correct person using two identifiers.   At orientation to the IOP program, Case Manager discussed the limitations of evaluation and management by telemedicine and the availability of in person appointments. The patient expressed understanding and agreed to proceed with virtual visits throughout the duration of the program.   Location:  Patient: Patient Home Provider: [facility]   History of Present Illness: MDD   Observations/Objective: Check In: Case Manager checked in with all participants to review discharge dates, insurance authorizations, work-related documents and needs from the treatment team regarding medications. Dennis Moreno stated needs and engaged in discussion.    Initial Therapeutic Activity: Counselor facilitated a check-in with Dennis Moreno to assess for safety, sobriety and medication compliance.  Counselor also inquired about Dennis Moreno's current emotional ratings, as well as any significant changes in thoughts, feelings or behavior since previous check in.  Dennis Moreno presented for session on time and was alert, oriented x5, with no evidence or self-report of active SI/HI or A/V H.  Dennis Moreno reported compliance with medication but reported that Dennis Moreno has been casually drinking recently, and started using Delta 8 to improve his mood.  Counselor encouraged Dennis Moreno to abstain from excessive substance use due to potential consequences that could result.  Dennis Moreno reported scores of 8/10 for depression, 0/10 for anxiety, and 0/10 for anger/irritability.  Dennis Moreno denied any recent outbursts or panic attacks.  Dennis Moreno reported that a recent success was attending his first group today and opening up to members about his struggle with recent depressive symptoms, including lack of pleasure in daily activities.  Dennis Moreno  reported that an additional struggle has been dealing with stress at work.  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     Second Therapeutic Activity: Counselor introduced topic of building social support network today.  Counselor explained how this can be defined as having a having a group of healthy people in one's life you can talk to, spend time with, and get help from to improve both mental and physical health.  Counselor noted that some barriers can make it difficult to connect with other people, including the presence of anxiety or depression, or moving to an unfamiliar area.  Group members were asked to assess the current state of their support network, and identify ways that this could be improved.  Tips were given on how to address previously noted barriers, such as strengthening social skills, using relaxation techniques to reduce anxiety, scheduling social time each week, and/or exploring social events nearby which could increase chances of meeting new supports.  Members were also encouraged to consider getting closer to people they already know through suggestions such as outreaching someone by text, email or phone call if they haven't spoken in awhile, doing something nice for a friend/family member unexpectedly, and/or inviting someone over for a game/movie/dinner night.  Intervention was effective, as evidenced by Dennis Moreno participating in discussion on the subject, reporting that Dennis Moreno lacks significant support in his network at this time, and this is a primary reason for seeking admission to group.  Dennis Moreno stated A lot of the time I would rather be by myself, but that has definitely caused some problems because I pushed away some good people.  Dennis Moreno reported that Dennis Moreno is motivated to strengthen existing connections with family  like his young brother, as well as curb isolation through ongoing group engagement, make new connections by improving  social skills so that Dennis Moreno is more approachable, and participate in social events around the community in order to practice these skills.    Third Therapeutic Activity: Counselor introduced Dennis Moreno, Dennis Moreno to provide psychoeducation on topic of Grief and Loss with members today.  Dennis Moreno began discussion by checking in with the group about their baseline mood today, general thoughts on what grief means to them and how it has affected them personally in the past.  Dennis Moreno provided information on how the process of grief/loss can differ depending upon one's unique culture, and categories of loss one could experience (i.e. loss of a person, animal, relationship, job, identity, etc).  Dennis Moreno encouraged members to be mindful of how pervasive loss can be, and how to recognize signs which could indicate that this is having an impact on one's overall mental health and wellbeing.  Intervention was effective, as evidenced by Dennis Moreno participating in discussion with speaker on the subject, reporting that a functional loss Dennis Moreno experienced was throwing out his back at work, and struggling with pain as a result, which led to alcohol abuse in an attempt to cope.  Dennis Moreno stated I lost myself, my credibility, ambition, discipline, and desire to do anything.  I was embarrassed to talk about it for awhile.    Assessment and Plan: Counselor recommends that Dennis Moreno remain in IOP treatment to better manage mental health symptoms, ensure stability and pursue completion of treatment plan goals. Counselor recommends adherence to crisis/safety plan, taking medications as prescribed, and following up with medical professionals if any issues arise.   Follow Up Instructions: Counselor will send Webex link for next session. Dennis Moreno was advised to call back or seek an in-person evaluation if the symptoms worsen or if the condition fails to improve as anticipated.   I provided 180 minutes of non-face-to-face time during  this encounter.   Noralee Stain, [place], LCAS 08/25/21

## 2021-08-26 ENCOUNTER — Ambulatory Visit (HOSPITAL_COMMUNITY): Payer: Federal, State, Local not specified - PPO | Admitting: Family

## 2021-08-26 ENCOUNTER — Telehealth (HOSPITAL_COMMUNITY): Payer: Self-pay | Admitting: Psychiatry

## 2021-08-26 NOTE — Progress Notes (Deleted)
Virtual Visit via Telephone Note  I connected with Dennis Moreno on 08/26/21 at  9:00 AM EST by telephone and verified that I am speaking with the correct person using two identifiers.  Location: Patient: Home Provider: Office     I discussed the limitations, risks, security and privacy concerns of performing an evaluation and management service by telephone and the availability of in person appointments. I also discussed with the patient that there may be a patient responsible charge related to this service. The patient expressed understanding and agreed to proceed.    I discussed the assessment and treatment plan with the patient. The patient was provided an opportunity to ask questions and all were answered. The patient agreed with the plan and demonstrated an understanding of the instructions.   The patient was advised to call back or seek an in-person evaluation if the symptoms worsen or if the condition fails to improve as anticipated.  I provided 15 minutes of non-face-to-face time during this encounter.   Oneta Rack, NP     Psychiatric Initial Adult Assessment   Patient Identification: Dennis Moreno MRN:  [id number] Date of Evaluation:  08/26/2021 Referral Source: Therapist  Chief Complaint:  "  Visit Diagnosis:    ICD-10-CM   1. Severe episode of recurrent major depressive disorder, without psychotic features (HCC)  F33.2     2. Recurrent depressive disorder (HCC)  F33.9       History of Present Illness:  past 2 years felt like he has been spriling out of control. After a injury at at his job. Reported history with acholic and marijuana. Reported that work has trigger is depression.    [place]  reported 50B, on going court case for the custody of his child (1 year son) and my daughter is unborn, charges against the union and post office.     Associated Signs/Symptoms: Depression Symptoms:  depressed mood, feelings of worthlessness/guilt, difficulty  concentrating, anxiety, (Hypo) Manic Symptoms:  Distractibility, Elevated Mood, Anxiety Symptoms:  Excessive Worry, Psychotic Symptoms:  Hallucinations: None PTSD Symptoms: NA  Past Psychiatric History:   Previous Psychotropic Medications: No   Substance Abuse History in the last 12 months:  Yes.    Consequences of Substance Abuse: NA  Past Medical History:  Past Medical History:  Diagnosis Date   Depression     Past Surgical History:  Procedure Laterality Date   WISDOM TOOTH EXTRACTION Bilateral 2001    Family Psychiatric History:   Family History:  Family History  Problem Relation Age of Onset   Arthritis Mother    Depression Mother    Alcohol abuse Father    Depression Father    Depression Sister    Depression Brother    Alcohol abuse Maternal Grandfather    Alcohol abuse Paternal Grandfather    Depression Paternal Grandfather    Depression Brother     Social History:   Social History   Socioeconomic History   Marital status: Single    Spouse name: Not on file   Number of children: Not on file   Years of education: Not on file   Highest education level: Not on file  Occupational History   Not on file  Tobacco Use   Smoking status: Former    Types: Cigarettes   Smokeless tobacco: Never  Vaping Use   Vaping Use: Never used  Substance and Sexual Activity   Alcohol use: Not Currently   Drug use: Not Currently    Types: Marijuana  Sexual activity: Not on file  Other Topics Concern   Not on file  Social History Narrative   Not on file   Social Determinants of Health   Financial Resource Strain: Not on file  Food Insecurity: Not on file  Transportation Needs: Not on file  Physical Activity: Not on file  Stress: Not on file  Social Connections: Not on file    Additional Social History:   Allergies:   Allergies  Allergen Reactions   Hydrocodone Itching    Metabolic Disorder Labs: Lab Results  Component Value Date   HGBA1C 5.5  11/22/2020   MPG 111.15 11/22/2020   No results found for: PROLACTIN No results found for: CHOL, TRIG, HDL, CHOLHDL, VLDL, LDLCALC Lab Results  Component Value Date   TSH 1.55 05/24/2017    Therapeutic Level Labs: No results found for: LITHIUM No results found for: CBMZ No results found for: VALPROATE  Current Medications: Current Outpatient Medications  Medication Sig Dispense Refill   apixaban (ELIQUIS) 5 MG TABS tablet Take 2 tablets (10mg ) twice daily for 7 days, then 1 tablet (5mg ) twice daily 60 tablet 0   BEE POLLEN PO Take 5 mLs by mouth daily.     meloxicam (MOBIC) 15 MG tablet Take 1 tablet (15 mg total) by mouth daily. 20 tablet 0   Multiple Vitamin (MULTIVITAMIN PO) Take 1 capsule by mouth in the morning and at bedtime. [address] [place], [place]     Omega-3 Fatty Acids (FISH OIL) 1000 MG CAPS Take 2 capsules by mouth daily.     Probiotic Product (PROBIOTIC PO) Take 2 capsules by mouth daily.     No current facility-administered medications for this visit.    Musculoskeletal:   Psychiatric Specialty Exam: Review of Systems  There were no vitals taken for this visit.There is no height or weight on file to calculate BMI.  General Appearance: Casual  Eye Contact:  Good  Speech:  Clear and Coherent  Volume:  Normal  Mood:  Anxious and Depressed  Affect:  Congruent  Thought Process:  Coherent  Orientation:  Full (Time, Place, and Person)  Thought Content:  Logical and Rumination  Suicidal Thoughts:  No  Homicidal Thoughts:  No  Memory:  Immediate;   Good Recent;   Good  Judgement:  Good  Insight:  Fair  Psychomotor Activity:  Normal  Concentration:  Concentration: Good  Recall:  Good  Fund of Knowledge:Good  Language: Good  Akathisia:  No  Handed:  Right  AIMS (if indicated):  done  Assets:  Communication Skills Desire for Improvement Resilience Social Support  ADL's:  Intact  Cognition: WNL  Sleep:  Good   Screenings: GAD-7     Flowsheet Row Office Visit from 10/10/2017 in [facility]  Total GAD-7 Score 20      PHQ2-9    Flowsheet Row Counselor from 08/25/2021 in [facility]AVIORAL [facility] INTENSIVE PSYCH Counselor from 10/21/2020 in BE[facility]IORAL HEALTH OUTPATIENT THERAPY [place] Office Visit from 10/10/2017 in [facility] 10/12/2017 from 05/24/2017 in [facility]  PHQ-2 Total Score 6 2 4 4   PHQ-9 Total Score 21 9 17 18       Flowsheet Row Counselor from 08/25/2021 in [facility] INT[facility] ED from 08/12/2021 in [facility] ED from 08/10/2021 in [facility]  C-SSRS RISK CATEGORY Error: Question 6 not populated No Risk No Risk       Assessment and Plan:  Oneta Rack, NP 12/14/20229:54 AM

## 2021-08-27 ENCOUNTER — Other Ambulatory Visit (HOSPITAL_COMMUNITY): Payer: Federal, State, Local not specified - PPO | Admitting: Licensed Clinical Social Worker

## 2021-08-27 ENCOUNTER — Other Ambulatory Visit: Payer: Self-pay

## 2021-08-27 DIAGNOSIS — F1011 Alcohol abuse, in remission: Secondary | ICD-10-CM | POA: Diagnosis not present

## 2021-08-27 DIAGNOSIS — F332 Major depressive disorder, recurrent severe without psychotic features: Secondary | ICD-10-CM

## 2021-08-27 DIAGNOSIS — F1291 Cannabis use, unspecified, in remission: Secondary | ICD-10-CM | POA: Diagnosis not present

## 2021-08-27 DIAGNOSIS — F419 Anxiety disorder, unspecified: Secondary | ICD-10-CM | POA: Diagnosis not present

## 2021-08-27 DIAGNOSIS — F439 Reaction to severe stress, unspecified: Secondary | ICD-10-CM | POA: Diagnosis not present

## 2021-08-27 DIAGNOSIS — F3340 Major depressive disorder, recurrent, in remission, unspecified: Secondary | ICD-10-CM | POA: Diagnosis not present

## 2021-08-27 DIAGNOSIS — R454 Irritability and anger: Secondary | ICD-10-CM | POA: Diagnosis not present

## 2021-08-27 DIAGNOSIS — Z604 Social exclusion and rejection: Secondary | ICD-10-CM | POA: Diagnosis not present

## 2021-08-27 DIAGNOSIS — Z638 Other specified problems related to primary support group: Secondary | ICD-10-CM | POA: Diagnosis not present

## 2021-08-27 DIAGNOSIS — R4587 Impulsiveness: Secondary | ICD-10-CM | POA: Diagnosis not present

## 2021-08-27 DIAGNOSIS — F418 Other specified anxiety disorders: Secondary | ICD-10-CM | POA: Diagnosis not present

## 2021-08-27 NOTE — Progress Notes (Signed)
Virtual Visit via Video Note   I connected with Dennis Moreno on 08/27/21 at  9:00 AM EDT by a video enabled telemedicine application and verified that I am speaking with the correct person using two identifiers.   At orientation to the IOP program, Case Manager discussed the limitations of evaluation and management by telemedicine and the availability of in person appointments. The patient expressed understanding and agreed to proceed with virtual visits throughout the duration of the program.   Location:  Patient: Patient Home Provider: [facility]   History of Present Illness: MDD   Observations/Objective: Check In: Case Manager checked in with all participants to review discharge dates, insurance authorizations, work-related documents and needs from the treatment team regarding medications. Dennis Moreno stated needs and engaged in discussion.    Initial Therapeutic Activity: Counselor facilitated a check-in with Dennis Moreno to assess for safety, sobriety and medication compliance.  Counselor also inquired about Dennis Moreno's current emotional ratings, as well as any significant changes in thoughts, feelings or behavior since previous check in.  Dennis Moreno presented for session on time and was alert, oriented x5, with no evidence or self-report of active SI/HI or A/V H.  Dennis Moreno reported compliance with medication and denied use of alcohol.  Dennis Moreno reported that he did use marijuana yesterday but denied any negative consequences.  Dennis Moreno reported scores of 5/10 for depression, 6/10 for anxiety, and 0/10 for anger/irritability.  Dennis Moreno denied any recent outbursts or panic attacks.  Dennis Moreno reported that a recent success was talking to the NP about medication options following recent increases in depressive symptoms.  He reported that a struggle has been dealing with erratic sleep patterns, which led him to oversleep yesterday and miss group. Dennis Moreno was receptive to feedback from counselor on  sleep hygiene changes that could be beneficial.      Second Therapeutic Activity: Counselor introduced topic of self-esteem today and defined this as the value an individual places on oneself, based upon assessment of personal worth as a human being and approval/disapproval of one's behavior. Counselor asked members to assess their level of self-esteem at this time based upon common indicators of high self-esteem, including: accepting oneself unconditionally; having self-respect and deep seated belief that one matters; being unaffected by other people's opinions/criticisms; and showing good control over emotions.  Counselor also explained concept of one's inner critic which serves to highlight faults and minimize strengths, directly influencing low sense of self-esteem.  Counselor then provided handout on 'strengths and qualities', which featured questions to guide discussion and increase awareness of each member's unique individual abilities which could reinforce higher self-esteem. Examples of questions included: 'things I am good at', 'challenges I have overcome', and 'what I like about myself'.  Intervention was effective, as evidenced by Dennis Moreno participating in discussion on the subject, reporting that his initial level of self-esteem today was 6/10.  He reported that isolation and substance abuse have likely been the biggest factors negatively affecting self-esteem, and reported that his goal will be to make more of an effort to socialize with others, avoid reliance upon substance to cope, and establish a healthy boundary with support system to avoid putting others need above his own.  Dennis Moreno also identified strengths to boost sense of self-esteem such as quitting heavy drinking as a challenge he has overcome, being resourceful, a problem solver, and resilient.  Dennis Moreno stated I feel like this was helpful to talk about today.    Assessment and Plan: Counselor recommends that Dennis Moreno remain in IOP  treatment to better manage mental health symptoms, ensure stability and pursue completion of treatment plan goals. Counselor recommends adherence to crisis/safety plan, taking medications as prescribed, and following up with medical professionals if any issues arise.   Follow Up Instructions: Counselor will send Webex link for next session. Dennis Moreno was advised to call back or seek an in-person evaluation if the symptoms worsen or if the condition fails to improve as anticipated.   I provided 180 minutes of non-face-to-face time during this encounter.   Dennis Moreno, [place], LCAS 08/27/21
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
## 2021-08-28 ENCOUNTER — Other Ambulatory Visit (HOSPITAL_COMMUNITY): Payer: Federal, State, Local not specified - PPO | Admitting: Licensed Clinical Social Worker

## 2021-08-28 DIAGNOSIS — F418 Other specified anxiety disorders: Secondary | ICD-10-CM | POA: Diagnosis not present

## 2021-08-28 DIAGNOSIS — Z604 Social exclusion and rejection: Secondary | ICD-10-CM | POA: Diagnosis not present

## 2021-08-28 DIAGNOSIS — F1011 Alcohol abuse, in remission: Secondary | ICD-10-CM | POA: Diagnosis not present

## 2021-08-28 DIAGNOSIS — F439 Reaction to severe stress, unspecified: Secondary | ICD-10-CM | POA: Diagnosis not present

## 2021-08-28 DIAGNOSIS — F332 Major depressive disorder, recurrent severe without psychotic features: Secondary | ICD-10-CM

## 2021-08-28 DIAGNOSIS — F1291 Cannabis use, unspecified, in remission: Secondary | ICD-10-CM | POA: Diagnosis not present

## 2021-08-28 DIAGNOSIS — R4587 Impulsiveness: Secondary | ICD-10-CM | POA: Diagnosis not present

## 2021-08-28 DIAGNOSIS — F419 Anxiety disorder, unspecified: Secondary | ICD-10-CM | POA: Diagnosis not present

## 2021-08-28 DIAGNOSIS — R454 Irritability and anger: Secondary | ICD-10-CM | POA: Diagnosis not present

## 2021-08-28 DIAGNOSIS — F3340 Major depressive disorder, recurrent, in remission, unspecified: Secondary | ICD-10-CM | POA: Diagnosis not present

## 2021-08-28 DIAGNOSIS — Z638 Other specified problems related to primary support group: Secondary | ICD-10-CM | POA: Diagnosis not present
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
## 2021-08-31 ENCOUNTER — Other Ambulatory Visit (HOSPITAL_COMMUNITY): Payer: Federal, State, Local not specified - PPO | Admitting: Psychiatry

## 2021-09-01 ENCOUNTER — Other Ambulatory Visit: Payer: Self-pay

## 2021-09-01 ENCOUNTER — Other Ambulatory Visit (HOSPITAL_COMMUNITY): Payer: Federal, State, Local not specified - PPO | Admitting: Licensed Clinical Social Worker

## 2021-09-01 DIAGNOSIS — F419 Anxiety disorder, unspecified: Secondary | ICD-10-CM | POA: Diagnosis not present

## 2021-09-01 DIAGNOSIS — R4587 Impulsiveness: Secondary | ICD-10-CM | POA: Diagnosis not present

## 2021-09-01 DIAGNOSIS — F3340 Major depressive disorder, recurrent, in remission, unspecified: Secondary | ICD-10-CM | POA: Diagnosis not present

## 2021-09-01 DIAGNOSIS — F1291 Cannabis use, unspecified, in remission: Secondary | ICD-10-CM | POA: Diagnosis not present

## 2021-09-01 DIAGNOSIS — F1011 Alcohol abuse, in remission: Secondary | ICD-10-CM | POA: Diagnosis not present

## 2021-09-01 DIAGNOSIS — F439 Reaction to severe stress, unspecified: Secondary | ICD-10-CM | POA: Diagnosis not present

## 2021-09-01 DIAGNOSIS — R454 Irritability and anger: Secondary | ICD-10-CM | POA: Diagnosis not present

## 2021-09-01 DIAGNOSIS — Z638 Other specified problems related to primary support group: Secondary | ICD-10-CM | POA: Diagnosis not present

## 2021-09-01 DIAGNOSIS — F332 Major depressive disorder, recurrent severe without psychotic features: Secondary | ICD-10-CM | POA: Diagnosis not present

## 2021-09-01 DIAGNOSIS — Z604 Social exclusion and rejection: Secondary | ICD-10-CM | POA: Diagnosis not present

## 2021-09-01 DIAGNOSIS — F418 Other specified anxiety disorders: Secondary | ICD-10-CM | POA: Diagnosis not present

## 2021-09-01 NOTE — Progress Notes (Signed)
Virtual Visit via Video Note   I connected with Dennis Moreno on 09/01/21 at  9:00 AM EDT by a video enabled telemedicine application and verified that I am speaking with the correct person using two identifiers.   At orientation to the IOP program, Case Manager discussed the limitations of evaluation and management by telemedicine and the availability of in person appointments. The patient expressed understanding and agreed to proceed with virtual visits throughout the duration of the program.   Location:  Patient: Patient Home Provider: [facility]   History of Present Illness: MDD   Observations/Objective: Check In: Case Manager checked in with all participants to review discharge dates, insurance authorizations, work-related documents and needs from the treatment team regarding medications. Dennis Moreno stated needs and engaged in discussion.    Initial Therapeutic Activity: Counselor facilitated a check-in with Dennis Moreno to assess for safety, sobriety and medication compliance.  Counselor also inquired about Dennis Moreno's current emotional ratings, as well as any significant changes in thoughts, feelings or behavior since previous check in.  Dennis Moreno presented for session on time and was alert, oriented x5, with no evidence or self-report of active SI/HI or A/V H.  Dennis Moreno reported compliance with medication.  He reported that he did use marijuana and have 1 Yuengling beer yesterday, but denied any consequences resulting from this.  Dennis Moreno reported scores of 1/10 for depression, 0/10 for anxiety, and 0/10 for anger/irritability.  Dennis Moreno denied any recent outbursts or panic attacks.  Dennis Moreno reported that a recent success was having a good day yesterday and sleeping in to get more rest.  Dennis Moreno reported that a recent struggle has been focusing on goals, so he will take time to outline these for the following week.     Second Therapeutic Activity: Counselor utilized a Cabin crew with  group members today to guide discussion on topic of codependency.  This handout defined codependency as excessive emotional or psychological reliance upon someone who requires support on account of an illness or addiction.  It also explained how this issue presents in dysfunctional family systems, including behavior such as denying existence of problems, rigid boundaries on communication, strained trust, lack of individuality, and reinforcement of unhealthy coping mechanisms such as substance use.  Characteristics of co-dependent people were listed for assistance with identification, such as extreme need for approval/recognition, difficulty identifying feelings, poor communication, and more.  Members were also tasked with completing a questionnaire in order to identify signs of codependency and results were discussed afterward.  This handout also offered strategies for resolving co-dependency within one's network, including increased use of assertive communication skills in order to set appropriate boundaries.  Intervention was effective, as evidenced by Dennis Moreno participating in discussion on the subject, and reporting that he used to have several traits of codependent behavior in the past, but has worked on this issue during individual therapy.  Dennis Moreno completed codependency questionnaire in group and reported that he had 10 positive indicators, so he will make it a goal moving forward to work on issues such as expressing feelings honestly to others, finding personal direction in his life, and setting appropriate boundaries with supports.  Dennis Moreno stated One of the most important things for me right now is to keep working on my interpersonal communication skills.       Assessment and Plan: Counselor recommends that Dennis Moreno remain in IOP treatment to better manage mental health symptoms, ensure stability and pursue completion of treatment plan goals. Counselor recommends adherence to crisis/safety plan,  taking  medications as prescribed, and following up with medical professionals if any issues arise.   Follow Up Instructions: Counselor will send Webex link for next session. Dennis Moreno was advised to call back or seek an in-person evaluation if the symptoms worsen or if the condition fails to improve as anticipated.   I provided 180 minutes of non-face-to-face time during this encounter.   Dennis Stain, LCSW, LCAS 09/01/21

## 2021-09-02 ENCOUNTER — Other Ambulatory Visit (HOSPITAL_COMMUNITY): Payer: Federal, State, Local not specified - PPO | Admitting: Licensed Clinical Social Worker

## 2021-09-02 DIAGNOSIS — F418 Other specified anxiety disorders: Secondary | ICD-10-CM | POA: Diagnosis not present

## 2021-09-02 DIAGNOSIS — Z604 Social exclusion and rejection: Secondary | ICD-10-CM | POA: Diagnosis not present

## 2021-09-02 DIAGNOSIS — F332 Major depressive disorder, recurrent severe without psychotic features: Secondary | ICD-10-CM | POA: Diagnosis not present

## 2021-09-02 DIAGNOSIS — F1291 Cannabis use, unspecified, in remission: Secondary | ICD-10-CM | POA: Diagnosis not present

## 2021-09-02 DIAGNOSIS — R454 Irritability and anger: Secondary | ICD-10-CM | POA: Diagnosis not present

## 2021-09-02 DIAGNOSIS — R4587 Impulsiveness: Secondary | ICD-10-CM | POA: Diagnosis not present

## 2021-09-02 DIAGNOSIS — F3340 Major depressive disorder, recurrent, in remission, unspecified: Secondary | ICD-10-CM | POA: Diagnosis not present

## 2021-09-02 DIAGNOSIS — F439 Reaction to severe stress, unspecified: Secondary | ICD-10-CM | POA: Diagnosis not present

## 2021-09-02 DIAGNOSIS — Z638 Other specified problems related to primary support group: Secondary | ICD-10-CM | POA: Diagnosis not present

## 2021-09-02 DIAGNOSIS — F1011 Alcohol abuse, in remission: Secondary | ICD-10-CM | POA: Diagnosis not present

## 2021-09-02 DIAGNOSIS — F419 Anxiety disorder, unspecified: Secondary | ICD-10-CM | POA: Diagnosis not present

## 2021-09-02 NOTE — Progress Notes (Signed)
Virtual Visit via Video Note   I connected with Dennis Moreno on 09/02/21 at  9:00 AM EDT by a video enabled telemedicine application and verified that I am speaking with the correct person using two identifiers.   At orientation to the IOP program, Case Manager discussed the limitations of evaluation and management by telemedicine and the availability of in person appointments. The patient expressed understanding and agreed to proceed with virtual visits throughout the duration of the program.   Location:  Patient: [facility] Home Provider: [facility]   History of Present Illness: MDD   Observations/Objective: Check In: Case Manager checked in with all participants to review discharge dates, insurance authorizations, work-related documents and needs from the treatment team regarding medications. Dennis Moreno stated needs and engaged in discussion.    Initial Therapeutic Activity: Counselor facilitated a check-in with Dennis Moreno to assess for safety, sobriety and medication compliance.  Counselor also inquired about Dennis Moreno's current emotional ratings, as well as any significant changes in thoughts, feelings or behavior since previous check in.  Dennis Moreno presented for session on time and was alert, oriented x5, with no evidence or self-report of active SI/HI or A/V H.  Dennis Moreno reported compliance with medication and denied recent use of alcohol.  He did disclose the he used marijuana yesterday afternoon.  Dennis Moreno reported scores of 1/10 for depression, 0/10 for anxiety, and 0/10 for anger/irritability.  Clearance denied any recent outbursts or panic attacks.  Dennis Moreno reported that a recent struggle was becoming sick yesterday afternoon, stating I ended up throwing up, but I'm not sure why.  I feel better today though.  Counselor recommended that Dennis Moreno speaker with his PCP about ongoing marijuana use and potential connection to nausea and appetite changes, providing information on cannabinoid  hyperemesis syndrome.  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     Second Therapeutic Activity: Counselor introduced topic of grounding skills today.  Counselor defined these as simple strategies one can use to help detach from difficult thoughts or feelings temporarily by focusing on something else.  Counselor noted that grounding will not solve the problem at hand, but can provide the practitioner with time to regain control over their thoughts and/or feelings and prevent the situation from getting worse (i.e. interrupting a panic attack).  Counselor divided these into three categories (mental, physical, and soothing) and then provided examples of each which group members could practice during session.  Some of these included describing one's environment in detail or playing a categories game with oneself for mental category, taking a hot bath/shower, stretching, or carrying a grounding object for physical category, and saying kind statements, or visualizing people one cares about for soothing category.  Counselor inquired about which techniques members have used with success in the past, or will commit to learning, practicing, and applying now to improve coping abilities.  Intervention was effective, as evidenced by Dennis Moreno participating in discussion on the subject, trying out several of the techniques during session, and expressing interest in adding several to his available coping skills, such as describing his surrounding environment in great detail, describing everyday activities in detail such as cooking a meal, reading something engrossing like a good book out loud, or counting to 10 while using deep breathing.    Third Therapeutic Activity: Counselor introduced Dennis Moreno, [employer], to provide psychoeducation on topic of nutrition with members  today.  Dennis Moreno virtually shared a comprehensive PowerPoint presentation to guide discussion, which featured the various components of healthy living that influence one's wellbeing, including practicing mindfulness, staying physically active, calm in mood, well-rested, and more.  Dennis Moreno explained how good nutrition reinforces positive physical and mental health, and shared a video which explained how food intake affects brain functioning in particular.  Dennis Moreno provided advice on how to adjust diet in order to promote wellbeing during course of treatment, including achieving balanced daily intake along with regular exercise.  Dennis Moreno offered the 'plate method' as a tool for proper distribution of protein, grains, starches, vegetables, fruit, and low calorie drink, in addition to concept of 'mindful eating', and covering current Dietary Guidelines for Americans for more tips.  Dennis Moreno inquired about changes members would like to make to their nutrition in order to increase overall wellbeing based upon information shared today, and time was allowed to ask any questions they might have of Dennis Moreno regarding her specialty.  Intervention was effective, as evidenced by Dennis Moreno participating in discussion with speaker on the subject, reporting that lately he has lacked appetite and will skip meals at times, which could explain low motivation at times.  He reported that his goal is to eat healthier foods more often, be mindful of the impact his support system can have on eating habits, and establish a diet that Helps me find peace, stay energized and avoid ending up in the hospital.    Assessment and Plan: Counselor recommends that Dennis Moreno remain in IOP treatment to better manage mental health symptoms, ensure stability and pursue completion of treatment plan goals. Counselor recommends adherence to crisis/safety plan, taking medications as prescribed, and following up with medical professionals if any issues arise.   Follow Up  Instructions: Counselor will send Webex link for next session. Dennis Moreno was advised to call back or seek an in-person evaluation if the symptoms worsen or if the condition fails to improve as anticipated.   I provided 180 minutes of non-face-to-face time during this encounter.   Dennis Moreno, [place], LCAS 09/02/21

## 2021-09-03 ENCOUNTER — Telehealth (HOSPITAL_COMMUNITY): Payer: Self-pay | Admitting: Psychiatry

## 2021-09-03 ENCOUNTER — Ambulatory Visit (HOSPITAL_COMMUNITY): Payer: Federal, State, Local not specified - PPO | Admitting: Psychiatry

## 2021-09-04 ENCOUNTER — Ambulatory Visit (HOSPITAL_COMMUNITY): Payer: Federal, State, Local not specified - PPO | Admitting: Psychiatry

## 2021-09-04 ENCOUNTER — Telehealth (HOSPITAL_COMMUNITY): Payer: Self-pay | Admitting: Psychiatry

## 2021-09-08 ENCOUNTER — Other Ambulatory Visit: Payer: Self-pay

## 2021-09-08 ENCOUNTER — Other Ambulatory Visit (HOSPITAL_COMMUNITY): Payer: Federal, State, Local not specified - PPO | Admitting: Licensed Clinical Social Worker

## 2021-09-08 DIAGNOSIS — Z604 Social exclusion and rejection: Secondary | ICD-10-CM | POA: Diagnosis not present

## 2021-09-08 DIAGNOSIS — R4587 Impulsiveness: Secondary | ICD-10-CM | POA: Diagnosis not present

## 2021-09-08 DIAGNOSIS — F332 Major depressive disorder, recurrent severe without psychotic features: Secondary | ICD-10-CM

## 2021-09-08 DIAGNOSIS — F1011 Alcohol abuse, in remission: Secondary | ICD-10-CM | POA: Diagnosis not present

## 2021-09-08 DIAGNOSIS — F439 Reaction to severe stress, unspecified: Secondary | ICD-10-CM | POA: Diagnosis not present

## 2021-09-08 DIAGNOSIS — F1291 Cannabis use, unspecified, in remission: Secondary | ICD-10-CM | POA: Diagnosis not present

## 2021-09-08 DIAGNOSIS — F418 Other specified anxiety disorders: Secondary | ICD-10-CM | POA: Diagnosis not present

## 2021-09-08 DIAGNOSIS — F419 Anxiety disorder, unspecified: Secondary | ICD-10-CM | POA: Diagnosis not present

## 2021-09-08 DIAGNOSIS — Z638 Other specified problems related to primary support group: Secondary | ICD-10-CM | POA: Diagnosis not present

## 2021-09-08 DIAGNOSIS — R454 Irritability and anger: Secondary | ICD-10-CM | POA: Diagnosis not present

## 2021-09-08 DIAGNOSIS — F3340 Major depressive disorder, recurrent, in remission, unspecified: Secondary | ICD-10-CM | POA: Diagnosis not present

## 2021-09-08 NOTE — Progress Notes (Signed)
Virtual Visit via Video Note   I connected with [name] on 09/08/21 at  9:00 AM EDT by a video enabled telemedicine application and verified that I am speaking with the correct person using two identifiers.   At orientation to the IOP program, Case Manager discussed the limitations of evaluation and management by telemedicine and the availability of in person appointments. The patient expressed understanding and agreed to proceed with virtual visits throughout the duration of the program.   Location:  Patient: [facility] Home Provider: [facility]   History of Present Illness: MDD   Observations/Objective: Check In: Case Manager checked in with all participants to review discharge dates, insurance authorizations, work-related documents and needs from the treatment team regarding medications. Matthan stated needs and engaged in discussion.    Initial Therapeutic Activity: Counselor facilitated a check-in with Kanoa to assess for safety, sobriety and medication compliance.  Counselor also inquired about Rehan's current emotional ratings, as well as any significant changes in thoughts, feelings or behavior since previous check in.  Lonzie presented for session on time and was alert, oriented x5, with no evidence or self-report of active SI/HI or A/V H.  Merrik reported compliance with medication and denied use of alcohol or illicit substances.  Saagar reported scores of 2/10 for depression, 6/10 for anxiety, and 6/10 for irritability.  Quinzell denied any recent panic attacks.  Achillies reported that a recent success was turning down alcohol offered from family over the holiday while they were in town.  Zadkiel reported that a recent struggle was dealing with family members airing out grievances at an inappropriate moment, which was upsetting, but he avoided reacting and was able to vent to someone about it later for support.      Second Therapeutic Activity: Counselor covered  topic of core beliefs with group today.  Counselor virtually shared a handout on the subject, which explained how everyone looks at the world differently, and two people can have the same experience, but have different interpretations of what happened.  Members were encouraged to think of these like sunglasses with different shades influencing perception towards positive or negative outcomes.  Examples of negative core beliefs were provided, such as I'm unlovable, I'm not good enough, and I'm a bad person.  Members were asked to share which one(s) they could relate to, and then identify evidence which contradicts these beliefs.  Counselor also provided psychoeducation on positive affirmations today.  Counselor explained how these are positive statements which can be spoken out loud or recited mentally to challenge negative thoughts and/or core beliefs to improve mood and outlook each day.  Counselor shared a comprehensive list of affirmations virtually to members with different categories, including ones for health, confidence, success, and happiness.  Counselor invited members to look through this list and identify any which resonated with them, and practice saying them out loud with sincerity.  Intervention was effective, as evidenced by [name] actively participating in discussion on the subject, and identifying several negative core beliefs which could be contributing to mental health challenges recently, including I am unworthy, I am unlovable, and I am not where I need to be in life.  Everardo reported that numerous consequences have resulted from these beliefs, including difficulty trusting others, depression, anxiety, substance use, and feelings of inadequacy.  Ronon was able to challenge the core belief I am unworthy during session today with assistance from group and counselor, recognizing that he is a caring, respectful person, serves as a positive  role model to his daughter, is  intelligent, and resourceful.  Konstantine also expressed interest in beginning practice of daily affirmations again to aid in resolving negative core beliefs, stating I believe that they do work and used to practice them a lot.  I really like the one 'I get better every day in each and every way'.    Assessment and Plan: Counselor recommends that [name] remain in IOP treatment to better manage mental health symptoms, ensure stability and pursue completion of treatment plan goals. Counselor recommends adherence to crisis/safety plan, taking medications as prescribed, and following up with medical professionals if any issues arise.   Follow Up Instructions: Counselor will send Webex link for next session. Merland was advised to call back or seek an in-person evaluation if the symptoms worsen or if the condition fails to improve as anticipated.   I provided 180 minutes of non-face-to-face time during this encounter.   Noralee Stain, [place], LCAS 09/08/21

## 2021-09-09 ENCOUNTER — Telehealth (HOSPITAL_COMMUNITY): Payer: Self-pay | Admitting: Licensed Clinical Social Worker

## 2021-09-09 ENCOUNTER — Ambulatory Visit (HOSPITAL_COMMUNITY): Payer: Federal, State, Local not specified - PPO

## 2021-09-09 NOTE — Telephone Encounter (Signed)
Clinician Dennis Moreno regarding his absence from virtual group therapy session this morning, but he did not answer, and a voicemail could not be left at this time. Clinician will send invite for tomorrows group and followup about today's absence.    Noralee Stain, [place], LCAS 09/09/21

## 2021-09-10 ENCOUNTER — Ambulatory Visit (HOSPITAL_COMMUNITY): Payer: Federal, State, Local not specified - PPO

## 2021-09-11 ENCOUNTER — Other Ambulatory Visit (HOSPITAL_COMMUNITY): Payer: Federal, State, Local not specified - PPO | Admitting: Licensed Clinical Social Worker

## 2021-09-11 ENCOUNTER — Other Ambulatory Visit: Payer: Self-pay

## 2021-09-11 DIAGNOSIS — F439 Reaction to severe stress, unspecified: Secondary | ICD-10-CM | POA: Diagnosis not present

## 2021-09-11 DIAGNOSIS — R4587 Impulsiveness: Secondary | ICD-10-CM | POA: Diagnosis not present

## 2021-09-11 DIAGNOSIS — F332 Major depressive disorder, recurrent severe without psychotic features: Secondary | ICD-10-CM | POA: Diagnosis not present

## 2021-09-11 DIAGNOSIS — F419 Anxiety disorder, unspecified: Secondary | ICD-10-CM | POA: Diagnosis not present

## 2021-09-11 DIAGNOSIS — Z604 Social exclusion and rejection: Secondary | ICD-10-CM | POA: Diagnosis not present

## 2021-09-11 DIAGNOSIS — F1291 Cannabis use, unspecified, in remission: Secondary | ICD-10-CM | POA: Diagnosis not present

## 2021-09-11 DIAGNOSIS — F1011 Alcohol abuse, in remission: Secondary | ICD-10-CM | POA: Diagnosis not present

## 2021-09-11 DIAGNOSIS — F3340 Major depressive disorder, recurrent, in remission, unspecified: Secondary | ICD-10-CM | POA: Diagnosis not present

## 2021-09-11 DIAGNOSIS — Z638 Other specified problems related to primary support group: Secondary | ICD-10-CM | POA: Diagnosis not present

## 2021-09-11 DIAGNOSIS — R454 Irritability and anger: Secondary | ICD-10-CM | POA: Diagnosis not present

## 2021-09-11 DIAGNOSIS — F418 Other specified anxiety disorders: Secondary | ICD-10-CM | POA: Diagnosis not present

## 2021-09-11 NOTE — Progress Notes (Signed)
Virtual Visit via Video Note   I connected with [name] on 09/11/21 at  9:00 AM EDT by a video enabled telemedicine application and verified that I am speaking with the correct person using two identifiers.   At orientation to the IOP program, Case Manager discussed the limitations of evaluation and management by telemedicine and the availability of in person appointments. The patient expressed understanding and agreed to proceed with virtual visits throughout the duration of the program.   Location:  Patient: [facility] Home Provider: [facility]   History of Present Illness: MDD   Observations/Objective: Check In: Case Manager checked in with all participants to review discharge dates, insurance authorizations, work-related documents and needs from the treatment team regarding medications. Rowan stated needs and engaged in discussion.    Initial Therapeutic Activity: Counselor facilitated a check-in with Chirag to assess for safety, sobriety and medication compliance.  Counselor also inquired about Oswell's current emotional ratings, as well as any significant changes in thoughts, feelings or behavior since previous check in.  Jacobey presented for session on time and was alert, oriented x5, with no evidence or self-report of active SI/HI or A/V H.  Uzoma reported compliance with medication and denied use of alcohol or illicit substances.  Nimai reported scores of 3/10 for depression, 7/10 for anxiety, and 6/10 for anger/irritability.  Dean denied any recent outbursts.  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   Patric reported that a recent struggle has been overloading himself with tasks, which led to a minor panic attack yesterday.  Ebon reported that his goal today is to take things slower, and relax at home.         Second Therapeutic Activity: Counselor introduced topic of stress management today.  Counselor provided definition of stress as feeling tense, overwhelmed, worn out, and/or exhausted, and noted that in small amounts, stress can be motivating until things become too overwhelming to manage.  Counselor also explained how stress can be acute (brief but intense) or chronic (long-lasting) and this can impact the severity of symptoms one can experience in the physical, emotional, and behavioral categories.  Counselor inquired about members' specific stressors, how long they have been prevalent, and the various symptoms that tend to manifest as a result.  Counselor also offered several stress management strategies to help improve members' coping ability, including journaling, gratitude practice, relaxation techniques, and time management tips.  Counselor also explained that research has shown a strong support network composed of trusted family, friends, or community members can increase resilience in times of stress, and inquired about who members can reach out to for help in managing stressors.  Counselor encouraged members to consider discussing stressor 'red flags' with their close supports that can be monitored and strategies for assisting them in times of crisis.  Intervention was effective, as evidenced by [name] actively engaging in discussion on subject, reporting that his stress level has been high for some time now, and present stressors have included job transitions, family conflict, and physical health.  Marley reported that stress symptoms he has experienced include feeling nausea, forgetfulness, excessive worrying, irritability, and hyperventilating at times.  Maddex reported that his stress management goal is to begin working out again regularly at least 2-3x per week for 30 minutes at a time to ensure a healthy stress outlet.  Zelma also participated in deep breathing exercise and reported that  it  was effective, although he needs to practice it more often.  Kamarii stated It definitely calmed me down a little bit.  Desman expressed interest in adding compassion meditation to his stress management practice as well, as this used to be helpful for him.  Geraldo reported that a pleasant activity he can plan to engage in for relaxation this weekend will include going for a walk in the park with his children.     Assessment and Plan: Counselor recommends that [name] remain in IOP treatment to better manage mental health symptoms, ensure stability and pursue completion of treatment plan goals. Counselor recommends adherence to crisis/safety plan, taking medications as prescribed, and following up with medical professionals if any issues arise.   Follow Up Instructions: Counselor will send Webex link for next session. Theodor was advised to call back or seek an in-person evaluation if the symptoms worsen or if the condition fails to improve as anticipated.   I provided 170 minutes of non-face-to-face time during this encounter.   Noralee Stain, LCSW, LCAS 09/11/21

## 2021-09-15 ENCOUNTER — Other Ambulatory Visit: Payer: Self-pay

## 2021-09-15 ENCOUNTER — Other Ambulatory Visit (HOSPITAL_COMMUNITY): Payer: Federal, State, Local not specified - PPO | Attending: Psychiatry | Admitting: Licensed Clinical Social Worker

## 2021-09-15 DIAGNOSIS — F332 Major depressive disorder, recurrent severe without psychotic features: Secondary | ICD-10-CM

## 2021-09-15 NOTE — Progress Notes (Signed)
Virtual Visit via Video Note   I connected with Dennis Moreno on 09/15/21 at  9:00 AM EDT by a video enabled telemedicine application and verified that I am speaking with the correct person using two identifiers.   At orientation to the IOP program, Case Manager discussed the limitations of evaluation and management by telemedicine and the availability of in person appointments. The patient expressed understanding and agreed to proceed with virtual visits throughout the duration of the program.   Location:  Patient: Patient Home Provider: [facility]   History of Present Illness: MDD   Observations/Objective: Check In: Case Manager checked in with all participants to review discharge dates, insurance authorizations, work-related documents and needs from the treatment team regarding medications. Dennis Moreno stated needs and engaged in discussion.    Initial Therapeutic Activity: Counselor facilitated a check-in with Dennis Moreno to assess for safety, sobriety and medication compliance.  Counselor also inquired about Dennis Moreno's current emotional ratings, as well as any significant changes in thoughts, feelings or behavior since previous check in.  Dennis Moreno presented for session on time and was alert, oriented x5, with no evidence or self-report of active SI/HI or A/V H.  Dennis Moreno reported compliance with medication and denied use of alcohol or illicit substances.  Dennis Moreno reported scores of 2/10 for depression, 5/10 for anxiety, and 4/10 for irritability.  Dennis Moreno denied any recent outbursts.  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  I'm wasting my creativity there.  Dennis Moreno reported that a recent struggle was experiencing a panic attack yesterday, stating I became overwhelmed thinking about my daughter and my unborn  son.  I have so much to prepare for.  Dennis Moreno was receptive to feedback and support from group and counselor on how to handle parenting challenges.       Second Therapeutic Activity: Counselor continued discussion upon topic of self-care today with group members.  Counselor virtually shared self-care assessment form with members via Webex to complete final sub-categories (i.e. social, spiritual, and professional).  Members were asked to rank their engagement in the activities listed for each dimension on a scale of 1-3, with 1 indicating 'Poor', 2 indicating 'Ok', and 3 indicating 'Well'.  Counselor invited members to share results of this assessment, and inquired about which areas of self-care they are doing well in, as well as areas that require attention, and how they plan to begin addressing this during treatment.  Intervention was effective, as evidenced by Dennis Moreno successfully completing final sections of assessment and actively engaging in discussion on subject, reporting that he is excelling in areas such as doing enjoyable activities with other people, spending alone time with his partner, having stimulating conversations, praying, meditating, recognizing things that give meaning to life, improving professional skills, and taking on rewarding or interesting projects, but would benefit from focusing more on areas such as calling or writing friends who live far away, asking for help when needed, meeting new people, and maintaining a better balance between professional and personal life.  Dennis Moreno reported that he would work to improve self-care deficits by outreaching his brother in [place] and cousin in [place], look for mental health red flags that could indicate he needs to outreach supportive peers, find new hobbies that increase exposure to positive people he can include in network, and create a business  that is both rewarding, but ensures adequate time for family.  Dennis Moreno stated This reminded  me that I don't have to be around anything unhealthy for me.      Assessment and Plan: Counselor recommends that Dennis Moreno remain in IOP treatment to better manage mental health symptoms, ensure stability and pursue completion of treatment plan goals. Counselor recommends adherence to crisis/safety plan, taking medications as prescribed, and following up with medical professionals if any issues arise.   Follow Up Instructions: Counselor will send Webex link for next session. Dennis Moreno was advised to call back or seek an in-person evaluation if the symptoms worsen or if the condition fails to improve as anticipated.   I provided 160 minutes of non-face-to-face time during this encounter.   Noralee Stain, [place], LCAS 09/15/21

## 2021-09-16 ENCOUNTER — Other Ambulatory Visit (HOSPITAL_COMMUNITY): Payer: Federal, State, Local not specified - PPO | Admitting: Psychiatry

## 2021-09-16 DIAGNOSIS — F332 Major depressive disorder, recurrent severe without psychotic features: Secondary | ICD-10-CM

## 2021-09-16 NOTE — Progress Notes (Signed)
Virtual Visit via Video Note   I connected with Dennis Moreno on 09/16/21 at  9:00 AM EDT by a video enabled telemedicine application and verified that I am speaking with the correct person using two identifiers.   At orientation to the IOP program, Case Manager discussed the limitations of evaluation and management by telemedicine and the availability of in person appointments. The patient expressed understanding and agreed to proceed with virtual visits throughout the duration of the program.   Location:  Patient: Patient Home Provider: [facility]   History of Present Illness: MDD   Observations/Objective: Check In: Case Manager checked in with all participants to review discharge dates, insurance authorizations, work-related documents and needs from the treatment team regarding medications. Dennis Moreno stated needs and engaged in discussion.    Initial Therapeutic Activity: Counselor facilitated a check-in with Dennis Moreno to assess for safety, sobriety and medication compliance.  Counselor also inquired about Dennis Moreno's current emotional ratings, as well as any significant changes in thoughts, feelings or behavior since previous check in.  Dennis Moreno presented for session on time and was alert, oriented x5, with no evidence or self-report of active SI/HI or A/V H.  Dennis Moreno reported compliance with medication and denied use of alcohol or illicit substances.  Dennis Moreno reported scores of 1/10 for depression, 0/10 for anxiety, and 6/10 for irritability.  Dennis Moreno denied any recent outbursts or panic attacks.  Dennis Moreno reported that a recent success was outreaching a childhood friend and working through some past issues that led them to lose touch in the past.  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        Second Therapeutic Activity: Counselor introduced topic of assertive communication today.  Counselor shared various handouts with members virtually in group to read along with on the subject.  These handouts defined assertive communication as a communication style in which a person stands up for their own needs and wants, while also taking into consideration the needs and wants of others, without behaving in a passive or aggressive way.  Traits of assertive communicators were highlighted such as using appropriate speaking volume, maintaining eye contact, using confident language, and avoiding interruption.  Members were also provided with tips on how to improve communication, including respecting oneself, expressing thoughts and feelings calmly, and saying No when necessary.  Members were given a variety of scenarios where they could practice using these tips to respond in an assertive manner.  Intervention was effective, as evidenced by Dennis Moreno actively participating in discussion on topic, reporting that historically he has held a very passive communication style, since he would often put others needs above his own, avoid confrontation and repress difficult feelings rather than speak up about them.  Dennis Moreno reported that when he could no longer compartmentalize, he would then adopt an aggressive communication style, stating I would finally push back, but come off a little harder than I meant to, so I would look like an asshole, and then feel guilty about it.  Dennis Moreno reported that his goal is to strike a healthy balance in communication styles via engagement in therapy so he can become more assertive and stand up for his rights confidently without regretting it afterward.  Dennis Moreno reported at 11:13am that his stomach had been hurting this morning and  gotten worse to the extent that he could no longer focus on group discussion.  He asked to be excused from the remainder of group  in order to visit the urgent care doctor to be assessed, and counselor was agreeable to this.    Assessment and Plan: Counselor recommends that Dennis Moreno remain in IOP treatment to better manage mental health symptoms, ensure stability and pursue completion of treatment plan goals. Counselor recommends adherence to crisis/safety plan, taking medications as prescribed, and following up with medical professionals if any issues arise.   Follow Up Instructions: Counselor will send Webex link for next session. Dennis Moreno was advised to call back or seek an in-person evaluation if the symptoms worsen or if the condition fails to improve as anticipated.   I provided 133 minutes of non-face-to-face time during this encounter.   Dennis Stain, LCSW, LCAS 09/16/21

## 2021-09-17 ENCOUNTER — Telehealth (HOSPITAL_COMMUNITY): Payer: Self-pay | Admitting: Psychiatry

## 2021-09-17 NOTE — Telephone Encounter (Signed)
D:  Pt didn't attend group nor did he call the case manager.  A: Placed call, but there was no answer and vm didn't pick up.  Inform treatment team.

## 2021-09-18 ENCOUNTER — Other Ambulatory Visit: Payer: Self-pay

## 2021-09-18 ENCOUNTER — Other Ambulatory Visit (HOSPITAL_COMMUNITY): Payer: Federal, State, Local not specified - PPO | Admitting: Psychiatry

## 2021-09-18 DIAGNOSIS — F332 Major depressive disorder, recurrent severe without psychotic features: Secondary | ICD-10-CM

## 2021-09-18 NOTE — Progress Notes (Signed)
Virtual Visit via Video Note   I connected with [name] on 09/18/21 at  9:00 AM EDT by a video enabled telemedicine application and verified that I am speaking with the correct person using two identifiers.   At orientation to the IOP program, Case Manager discussed the limitations of evaluation and management by telemedicine and the availability of in person appointments. The patient expressed understanding and agreed to proceed with virtual visits throughout the duration of the program.   Location:  Patient: Patient Home Provider: [facility]   History of Present Illness: MDD   Observations/Objective: Check In: Case Manager checked in with all participants to review discharge dates, insurance authorizations, work-related documents and needs from the treatment team regarding medications. Torres stated needs and engaged in discussion.    Initial Therapeutic Activity: Counselor facilitated a check-in with Bee to assess for safety, sobriety and medication compliance.  Counselor also inquired about Lemoyne's current emotional ratings, as well as any significant changes in thoughts, feelings or behavior since previous check in.  Brooklyn presented for session on time and was alert, oriented x5, with no evidence or self-report of active SI/HI or A/V H.  Blanchard reported compliance with medication and denied use of alcohol or illicit substances.  Taje reported scores of 0/10 for depression, 1/10 for anxiety, and 2/10 for anger/irritability.  Carlitos denied any recent outbursts or panic attacks.  Rollins reported that a recent success was getting more nightly rest.  Ambrose reported that a recent struggle was oversleeping yesterday and missing group.  Jiyaan reported that his goal this weekend is to continue preparing for his newborn, and work on his business proposal.     Second Therapeutic Activity: Counselor discussed topic of distress tolerance with clients today.   Counselor defined distress intolerance as the perceived inability to experience unpleasant, aversive, or uncomfortable feelings, often accompanied by a desperate need to escape these uncomfortable emotions.  Counselor assisted members in categorizing their common undesirable emotions (i.e. mad, sad, and scared) and tasked members with completing a questionnaire to determine whether distress intolerance is a significant problem for them.  Counselor provided psychoeducation on how this issue can develop during one's life course, core beliefs that can influence this perspective, and helpful strategies for resolving this issue, including mindfulness, visualization, and exploration of activities that center on the concept of 'activating and soothing'.  Intervention was effective, as evidenced by [name] participating in discussion on the subject, and reporting that he considers himself to be distress intolerant to some extent, as there are several emotions he has attempted to conceal from people over recent years due to fear of judgement from peers.  Rockne reported that some of the include emotions from the 'sad' category such as hurt, shame, sadness and despair, as well as ones from the 'mad' category such as frustration, rage, and irritation.  Kartel reported that his goal is to get more in touch and accepting of his true feelings through practicing meditation again regularly, stating I mask my emotions all the time, and its got to the point where I feel emotionless almost.  Kriss had to leave group at 10:30am in order to attend a doctor's appointment today.    Assessment and Plan: Counselor recommends that [name] remain in IOP treatment to better manage mental health symptoms, ensure stability and pursue completion of treatment plan goals. Counselor recommends adherence to crisis/safety plan, taking medications as prescribed, and following up with medical professionals if any issues arise.   Follow  Up Instructions: Counselor will send Webex link for next session. Daniyal was advised to call back or seek an in-person evaluation if the symptoms worsen or if the condition fails to improve as anticipated.   I provided 90 minutes of non-face-to-face time during this encounter.   Noralee Stain, LCSW, LCAS 09/18/21

## 2021-09-21 ENCOUNTER — Ambulatory Visit (HOSPITAL_COMMUNITY): Payer: Federal, State, Local not specified - PPO

## 2021-09-22 ENCOUNTER — Ambulatory Visit (HOSPITAL_COMMUNITY): Payer: Federal, State, Local not specified - PPO

## 2021-09-23 ENCOUNTER — Telehealth (HOSPITAL_COMMUNITY): Payer: Self-pay | Admitting: Psychiatry

## 2021-09-23 ENCOUNTER — Ambulatory Visit (HOSPITAL_COMMUNITY): Payer: Federal, State, Local not specified - PPO | Admitting: Family

## 2021-09-23 NOTE — Telephone Encounter (Signed)
D:  Pt has been absent three days (all this week so far) from MH-IOP.  Pt hasn't called or returned case mgr's calls. Will discharge patient today.  F/U with approved providers.  Will encourage pt to contact his insurance company for providers; since [place], [place], LCAS is declining pt to return to her at least one more time before she leaves [place] in February 2023. A:  Inform treatment team.

## 2021-09-23 NOTE — Patient Instructions (Signed)
D:  Patient will be discharged today.  A:  Follow up with approved providers.  Return to work without any restrictions.
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
## 2021-09-24 ENCOUNTER — Ambulatory Visit (HOSPITAL_COMMUNITY): Payer: Federal, State, Local not specified - PPO

## 2021-09-25 ENCOUNTER — Ambulatory Visit (HOSPITAL_COMMUNITY): Payer: Federal, State, Local not specified - PPO

## 2021-10-27 ENCOUNTER — Telehealth (HOSPITAL_COMMUNITY): Payer: Self-pay | Admitting: Psychiatry

## 2021-10-27 NOTE — Telephone Encounter (Signed)
D:  Pt phoned case manager stating that he will be dropping off FMLA forms to be completed.  Reports he needs them back as soon as possible.  A:  Reiterated to pt, that case manager has been waiting for the forms whenever pt was admitted back in December 2022 and he never dropped them off.  Pt last day of attendance was 09-18-21 and d/c'd on 09-23-21.  Pt was non-compliant with attendance the whole time he attended MH-IOP. Pt replied he has a right to his chart.  Informed pt that completing the form may hinder his FMLA instead of helping his case d/t excessive absences.  Informed pt that he could drop forms off and sign a ROI.  Inform Hillery Jacks, NP and Everlene Balls, RN (clinical supervisor).

## 2021-12-31 ENCOUNTER — Telehealth: Payer: Federal, State, Local not specified - PPO | Admitting: Family Medicine

## 2021-12-31 DIAGNOSIS — Z7689 Persons encountering health services in other specified circumstances: Secondary | ICD-10-CM

## 2021-12-31 NOTE — Patient Instructions (Signed)
Please follow up with your Primary or mental health providers for a return to work note. ? ? ?

## 2021-12-31 NOTE — Progress Notes (Signed)
[place] ? ? ?Needs to follow up with providers- PC about going back to work post bonding time with infant.  ? ?Patient acknowledged agreement and understanding of the plan.  ? ?

## 2022-01-07 ENCOUNTER — Telehealth (HOSPITAL_COMMUNITY): Payer: Self-pay | Admitting: Psychiatry

## 2022-01-07 NOTE — Telephone Encounter (Addendum)
D:  Pt called the MH-IOP case manager and left vm that it is urgent that she calls him back re: a more specific doctor's note to return to work.  A:  Case manager will discuss with Hillery Jacks, NP, because patient is no longer in MH-IOP.  Pt was discharged on 09-23-21 due to non-compliancy with attendance.  Last day in attendance was 09-18-21. ?

## 2022-01-08 ENCOUNTER — Other Ambulatory Visit: Payer: Self-pay | Admitting: Family

## 2022-01-08 NOTE — Progress Notes (Signed)
NP spoke to patient regarding a "new return to work letter."  Patient is requesting for a Occupational Health form ( ELM [phone]) to be completed for the [employer] which will allow him to return to work.  ? ?Patient was discharged form IOP due to attendances on 09/18/2021. Spoke to patient about following up with his outpatient providers, as he wasn't under my care at this time. NP is unable to attest to patient's current mental state/health. Patient was made aware of my current recommendations.  ? ?Take all medications as prescribed. ?Keep all follow-up appointments as scheduled.  ?Do not consume alcohol or use illegal drugs while on prescription medications. ?Report any adverse effects from your medications to your primary care provider promptly.  ?In the event of recurrent symptoms or worsening symptoms, call 911, a crisis hotline, or go to the nearest emergency department for evaluation.   ?

## 2022-03-19 ENCOUNTER — Telehealth: Payer: Federal, State, Local not specified - PPO | Admitting: Emergency Medicine

## 2022-03-19 DIAGNOSIS — R110 Nausea: Secondary | ICD-10-CM | POA: Diagnosis not present

## 2022-03-19 NOTE — Progress Notes (Signed)
Virtual Visit Consent   Dennis Moreno, you are scheduled for a virtual visit with a [facility] provider today. Just as with appointments in the office, your consent must be obtained to participate. Your consent will be active for this visit and any virtual visit you may have with one of our providers in the next 365 days. If you have a MyChart account, a copy of this consent can be sent to you electronically.  As this is a virtual visit, video technology does not allow for your provider to perform a traditional examination. This may limit your provider's ability to fully assess your condition. If your provider identifies any concerns that need to be evaluated in person or the need to arrange testing (such as labs, EKG, etc.), we will make arrangements to do so. Although advances in technology are sophisticated, we cannot ensure that it will always work on either your end or our end. If the connection with a video visit is poor, the visit may have to be switched to a telephone visit. With either a video or telephone visit, we are not always able to ensure that we have a secure connection.  By engaging in this virtual visit, you consent to the provision of healthcare and authorize for your insurance to be billed (if applicable) for the services provided during this visit. Depending on your insurance coverage, you may receive a charge related to this service.  I need to obtain your verbal consent now. Are you willing to proceed with your visit today? Dennis Moreno has provided verbal consent on 03/19/2022 for a virtual visit (video or telephone). Cathlyn Parsons, NP  Date: 03/19/2022 11:58 AM  Virtual Visit via Video Note   I, Cathlyn Parsons, connected with  Dennis Moreno  ([id number], [birth year]) on 03/19/22 at 11:45 AM EDT by a video-enabled telemedicine application and verified that I am speaking with the correct person using two identifiers.  Location: Patient: Virtual Visit Location  Patient: Home Provider: Virtual Visit Location Provider: Home Office   I discussed the limitations of evaluation and management by telemedicine and the availability of in person appointments. The patient expressed understanding and agreed to proceed.    History of Present Illness: Dennis Moreno is a 36 y.o. who identifies as a male who was assigned male at birth, and is being seen today for feeling strange after taking an unmeasured dose of black seed oil yesterday.  Flaxseed oil is something he purchases as a supplement from a store.  He is unsure off the top of his head of the brand.  Yesterday he did not measure a dose but just drank some from the bottle.  Shortly thereafter he began feeling what he describes as "crazy" and his head felt weird and he felt very nauseated.  Today he feels like himself again and wants to make sure he is okay to return to work.  Denies dizziness, shortness of breath, chest pain, nausea, abdominal pain.  HPI: HPI  Problems:  Patient Active Problem List   Diagnosis Date Noted   Rhabdomyolysis 11/20/2020   Low back pain 04/18/2020   Depression, major, single episode, moderate (HCC) 10/10/2017   Anxiety 10/10/2017    Allergies:  Allergies  Allergen Reactions   Hydrocodone Itching   Medications:  Current Outpatient Medications:    apixaban (ELIQUIS) 5 MG TABS tablet, Take 2 tablets (10mg ) twice daily for 7 days, then 1 tablet (5mg ) twice daily, Disp: 60 tablet, Rfl: 0  BEE POLLEN PO, Take 5 mLs by mouth daily., Disp: , Rfl:    meloxicam (MOBIC) 15 MG tablet, Take 1 tablet (15 mg total) by mouth daily., Disp: 20 tablet, Rfl: 0   Multiple Vitamin (MULTIVITAMIN PO), Take 1 capsule by mouth in the morning and at bedtime. [address] [place], [place], Disp: , Rfl:    Omega-3 Fatty Acids (FISH OIL) 1000 MG CAPS, Take 2 capsules by mouth daily., Disp: , Rfl:    Probiotic Product (PROBIOTIC PO), Take 2 capsules by mouth daily., Disp: , Rfl:    Observations/Objective: Patient is well-developed, well-nourished in no acute distress.  Resting comfortably  at home.  Head is normocephalic, atraumatic.  No labored breathing.  Speech is clear and coherent with logical content.  Patient is alert and oriented at baseline.    Assessment and Plan: 1. Nausea  Discussed with patient that supplements are not regulated or quality tested and its possible he ingested an unknown substance or an overdose of flaxseed oil.  Cautioned him against using supplements  Follow Up Instructions: I discussed the assessment and treatment plan with the patient. The patient was provided an opportunity to ask questions and all were answered. The patient agreed with the plan and demonstrated an understanding of the instructions.  A copy of instructions were sent to the patient via MyChart unless otherwise noted below.   The patient was advised to call back or seek an in-person evaluation if the symptoms worsen or if the condition fails to improve as anticipated.  Time:  I spent 10 minutes with the patient via telehealth technology discussing the above problems/concerns.    Cathlyn Parsons, NP

## 2022-03-19 NOTE — Patient Instructions (Signed)
  Dennis Moreno, thank you for joining Cathlyn Parsons, NP for today's virtual visit.  While this provider is not your primary care provider (PCP), if your PCP is located in our provider database this encounter information will be shared with them immediately following your visit.  Consent: (Patient) Dennis Moreno provided verbal consent for this virtual visit at the beginning of the encounter.  Current Medications:  Current Outpatient Medications:    apixaban (ELIQUIS) 5 MG TABS tablet, Take 2 tablets (10mg ) twice daily for 7 days, then 1 tablet (5mg ) twice daily, Disp: 60 tablet, Rfl: 0   BEE POLLEN PO, Take 5 mLs by mouth daily., Disp: , Rfl:    meloxicam (MOBIC) 15 MG tablet, Take 1 tablet (15 mg total) by mouth daily., Disp: 20 tablet, Rfl: 0   Multiple Vitamin (MULTIVITAMIN PO), Take 1 capsule by mouth in the morning and at bedtime. [address] [place], [place], Disp: , Rfl:    Omega-3 Fatty Acids (FISH OIL) 1000 MG CAPS, Take 2 capsules by mouth daily., Disp: , Rfl:    Probiotic Product (PROBIOTIC PO), Take 2 capsules by mouth daily., Disp: , Rfl:    Medications ordered in this encounter:  No orders of the defined types were placed in this encounter.    *If you need refills on other medications prior to your next appointment, please contact your pharmacy*  Follow-Up: Call back or seek an in-person evaluation if the symptoms worsen or if the condition fails to improve as anticipated.  Other Instructions Please be cautious taking supplements like black seed oil.  Supplements are not regulated by anybody and sometimes they can contain substances that are not on the label or in different doses than what is on the label.   If you have been instructed to have an in-person evaluation today at a local Urgent Care facility, please use the link below. It will take you to a list of all of our available [facility], including address, phone number and hours of  operation. Please do not delay care.  [facility]  If you or a family member do not have a primary care provider, use the link below to schedule a visit and establish care. When you choose a [facility] primary care physician or advanced practice provider, you gain a long-term partner in health. Find a Primary Care Provider  Learn more about [facility]'s in-office and virtual care options:  - Get Care Now

## 2022-03-30 ENCOUNTER — Telehealth: Payer: Federal, State, Local not specified - PPO | Admitting: Emergency Medicine

## 2022-03-30 DIAGNOSIS — T63441A Toxic effect of venom of bees, accidental (unintentional), initial encounter: Secondary | ICD-10-CM | POA: Diagnosis not present

## 2022-03-30 MED ORDER — CEPHALEXIN 500 MG PO CAPS: 500.0000 mg | ORAL_CAPSULE | Freq: Two times a day (BID) | ORAL | 0 refills | Status: DC

## 2022-03-30 MED ORDER — FAMOTIDINE 20 MG PO TABS: 20.0000 mg | ORAL_TABLET | Freq: Two times a day (BID) | ORAL | 0 refills | Status: AC

## 2022-03-30 MED ORDER — DIPHENHYDRAMINE HCL 25 MG PO TABS: 25.0000 mg | ORAL_TABLET | Freq: Four times a day (QID) | ORAL | 0 refills | Status: DC | PRN

## 2022-03-30 NOTE — Progress Notes (Signed)
Virtual Visit Consent   Dennis Moreno, you are scheduled for a virtual visit with a [facility] provider today. Just as with appointments in the office, your consent must be obtained to participate. Your consent will be active for this visit and any virtual visit you may have with one of our providers in the next 365 days. If you have a MyChart account, a copy of this consent can be sent to you electronically.  As this is a virtual visit, video technology does not allow for your provider to perform a traditional examination. This may limit your provider's ability to fully assess your condition. If your provider identifies any concerns that need to be evaluated in person or the need to arrange testing (such as labs, EKG, etc.), we will make arrangements to do so. Although advances in technology are sophisticated, we cannot ensure that it will always work on either your end or our end. If the connection with a video visit is poor, the visit may have to be switched to a telephone visit. With either a video or telephone visit, we are not always able to ensure that we have a secure connection.  By engaging in this virtual visit, you consent to the provision of healthcare and authorize for your insurance to be billed (if applicable) for the services provided during this visit. Depending on your insurance coverage, you may receive a charge related to this service.  I need to obtain your verbal consent now. Are you willing to proceed with your visit today? Dennis Moreno has provided verbal consent on 03/30/2022 for a virtual visit (video or telephone). Roxy Horseman, PA-C  Date: 03/30/2022 10:48 AM  Virtual Visit via Video Note   I, Roxy Horseman, connected with  Dennis Moreno  ([id number], 1986/07/08) on 03/30/22 at 10:45 AM EDT by a video-enabled telemedicine application and verified that I am speaking with the correct person using two identifiers.  Location: Patient: Virtual Visit Location  Patient: Mobile Provider: Virtual Visit Location Provider: Home Office   I discussed the limitations of evaluation and management by telemedicine and the availability of in person appointments. The patient expressed understanding and agreed to proceed.    History of Present Illness: Dennis Moreno is a 36 y.o. who identifies as a male who was assigned male at birth, and is being seen today for  bee stings.  He states that he was stung 2 days ago.  This morning he woke up with significant swelling on his arm and leg where he was stung.  He denies any successful treatments.  He tried putting oil on them.  States that they are itchy, but not painful.  HPI: HPI  Problems:  Patient Active Problem List   Diagnosis Date Noted   Rhabdomyolysis 11/20/2020   Low back pain 04/18/2020   Depression, major, single episode, moderate (HCC) 10/10/2017   Anxiety 10/10/2017    Allergies:  Allergies  Allergen Reactions   Hydrocodone Itching   Medications:  Current Outpatient Medications:    apixaban (ELIQUIS) 5 MG TABS tablet, Take 2 tablets (10mg ) twice daily for 7 days, then 1 tablet (5mg ) twice daily, Disp: 60 tablet, Rfl: 0   BEE POLLEN PO, Take 5 mLs by mouth daily., Disp: , Rfl:    meloxicam (MOBIC) 15 MG tablet, Take 1 tablet (15 mg total) by mouth daily., Disp: 20 tablet, Rfl: 0   Multiple Vitamin (MULTIVITAMIN PO), Take 1 capsule by mouth in the morning and at bedtime. [place],  [place], [place], Disp: , Rfl:    Omega-3 Fatty Acids (FISH OIL) 1000 MG CAPS, Take 2 capsules by mouth daily., Disp: , Rfl:    Probiotic Product (PROBIOTIC PO), Take 2 capsules by mouth daily., Disp: , Rfl:   Observations/Objective: Patient is well-developed, well-nourished in no acute distress.  Resting comfortably  at home.  Head is normocephalic, atraumatic.  No labored breathing.  Speech is clear and coherent with logical content.  Patient is alert and oriented at baseline.  No trouble  breathing Normal voice Swelling on arm is noted on video  Assessment and Plan: 1. Bee sting, accidental or unintentional, initial encounter  Benadryl and pepcid for swelling and allergic reaction.  Add keflex due to delayed presentation to cover infection.   Follow Up Instructions: I discussed the assessment and treatment plan with the patient. The patient was provided an opportunity to ask questions and all were answered. The patient agreed with the plan and demonstrated an understanding of the instructions.  A copy of instructions were sent to the patient via MyChart unless otherwise noted below.     The patient was advised to call back or seek an in-person evaluation if the symptoms worsen or if the condition fails to improve as anticipated.  Time:  I spent 10 minutes with the patient via telehealth technology discussing the above problems/concerns.    Roxy Horseman, PA-C

## 2022-04-05 ENCOUNTER — Encounter (HOSPITAL_BASED_OUTPATIENT_CLINIC_OR_DEPARTMENT_OTHER): Payer: Self-pay | Admitting: Emergency Medicine

## 2022-04-05 ENCOUNTER — Emergency Department (HOSPITAL_BASED_OUTPATIENT_CLINIC_OR_DEPARTMENT_OTHER)
Admission: EM | Admit: 2022-04-05 | Discharge: 2022-04-05 | Disposition: A | Discharge status: Home / Self Care | Payer: Federal, State, Local not specified - PPO | Attending: Emergency Medicine | Admitting: Emergency Medicine

## 2022-04-05 ENCOUNTER — Other Ambulatory Visit: Payer: Self-pay

## 2022-04-05 DIAGNOSIS — L509 Urticaria, unspecified: Secondary | ICD-10-CM | POA: Diagnosis not present

## 2022-04-05 DIAGNOSIS — R21 Rash and other nonspecific skin eruption: Secondary | ICD-10-CM | POA: Diagnosis present

## 2022-04-05 DIAGNOSIS — L259 Unspecified contact dermatitis, unspecified cause: Secondary | ICD-10-CM | POA: Diagnosis not present

## 2022-04-05 DIAGNOSIS — Z79899 Other long term (current) drug therapy: Secondary | ICD-10-CM | POA: Diagnosis not present

## 2022-04-05 DIAGNOSIS — Z7901 Long term (current) use of anticoagulants: Secondary | ICD-10-CM | POA: Insufficient documentation

## 2022-04-05 MED ORDER — PREDNISONE 50 MG PO TABS
50.0000 mg | ORAL_TABLET | Freq: Once | ORAL | Status: AC
  Administered 2022-04-05: 50 mg via ORAL
  Filled 2022-04-05: qty 1

## 2022-04-05 MED ORDER — HYDROCORTISONE 2.5 % EX LOTN: TOPICAL_LOTION | Freq: Two times a day (BID) | CUTANEOUS | 0 refills | Status: AC

## 2022-04-05 MED ORDER — PREDNISONE 10 MG PO TABS: 20.0000 mg | ORAL_TABLET | Freq: Every day | ORAL | 0 refills | Status: AC

## 2022-04-05 MED ORDER — HYDROXYZINE HCL 25 MG PO TABS: 25.0000 mg | ORAL_TABLET | Freq: Four times a day (QID) | ORAL | 0 refills | Status: AC

## 2022-04-05 NOTE — Discharge Instructions (Addendum)
Take the medications as prescribed.  Return for any new or worsening symptoms. °

## 2022-04-05 NOTE — ED Provider Notes (Signed)
[facility] EMERGENCY DEPARTMENT Provider Note   CSN: [id number] Arrival date & time: 04/05/22  1828     History  Chief Complaint  Patient presents with   Insect Bite    Dennis Moreno is a 36 y.o. male with past medical history significant for depression, rhabdomyolysis here for evaluation of possible allergic reaction.  States he was stung by bees approximately 2 weeks ago.  Since then has had intermittent urticaria as well as red pruritic lesions.  No new lotions, perfumes, detergents.  He was seen in a video visit prescribed Pepcid as well as antibiotics for possible cellulitis.  States medications did not help.  No new medications.  No intraoral lesions.  No sensation of throat closing. No hx of similar.  HPI     Home Medications Prior to Admission medications   Medication Sig Start Date End Date Taking? Authorizing Provider  hydrocortisone 2.5 % lotion Apply topically 2 (two) times daily. 04/05/22  Yes Dericka Ostenson A, PA-C  hydrOXYzine (ATARAX) 25 MG tablet Take 1 tablet (25 mg total) by mouth every 6 (six) hours. 04/05/22  Yes Camille Thau A, PA-C  predniSONE (DELTASONE) 10 MG tablet Take 2 tablets (20 mg total) by mouth daily for 5 days. 04/05/22 04/10/22 Yes Symone Cornman A, PA-C  apixaban (ELIQUIS) 5 MG TABS tablet Take 2 tablets (10mg ) twice daily for 7 days, then 1 tablet (5mg ) twice daily 08/12/21   Redwine, Madison A, PA-C  BEE POLLEN PO Take 5 mLs by mouth daily.    [provider]  cephALEXin (KEFLEX) 500 MG capsule Take 1 capsule (500 mg total) by mouth 2 (two) times daily. 03/30/22   08/14/21, PA-C  diphenhydrAMINE (BENADRYL) 25 MG tablet Take 1 tablet (25 mg total) by mouth every 6 (six) hours as needed. 03/30/22   Roxy Horseman, PA-C  famotidine (PEPCID) 20 MG tablet Take 1 tablet (20 mg total) by mouth 2 (two) times daily. 03/30/22   Roxy Horseman, PA-C  meloxicam (MOBIC) 15 MG tablet Take 1 tablet (15 mg total) by mouth  daily. 06/25/21   Roxy Horseman, PA-C  Multiple Vitamin (MULTIVITAMIN PO) Take 1 capsule by mouth in the morning and at bedtime. 06/27/21, [place], [place]    [provider]  Omega-3 Fatty Acids (FISH OIL) 1000 MG CAPS Take 2 capsules by mouth daily.    [provider]  Probiotic Product (PROBIOTIC PO) Take 2 capsules by mouth daily.    [provider]      Allergies    Hydrocodone    Review of Systems   Review of Systems  Constitutional: Negative.   HENT: Negative.    Respiratory: Negative.    Cardiovascular: Negative.   Gastrointestinal: Negative.   Genitourinary: Negative.   Musculoskeletal: Negative.   Skin:  Positive for rash.  All other systems reviewed and are negative.   Physical Exam Updated Vital Signs BP 121/86 (BP Location: Left Arm)   Pulse 74   Temp 98.5 F (36.9 C) (Oral)   Resp 18   Ht 5\' 8"  (1.727 m)   Wt 65.8 kg   SpO2 98%   BMI 22.05 kg/m  Physical Exam Vitals and nursing note reviewed.  Constitutional:      General: He is not in acute distress.    Appearance: He is well-developed. He is not ill-appearing, toxic-appearing or diaphoretic.  HENT:     Head: Normocephalic and atraumatic.     Nose: Nose normal.  Mouth/Throat:     Mouth: Mucous membranes are moist.     Comments: PO clear Eyes:     Pupils: Pupils are equal, round, and reactive to light.  Cardiovascular:     Rate and Rhythm: Normal rate and regular rhythm.     Pulses: Normal pulses.     Heart sounds: Normal heart sounds.  Pulmonary:     Effort: Pulmonary effort is normal. No respiratory distress.     Breath sounds: Normal breath sounds.  Abdominal:     General: Bowel sounds are normal. There is no distension.     Palpations: Abdomen is soft.  Musculoskeletal:        General: No swelling, tenderness, deformity or signs of injury. Normal range of motion.     Cervical back: Normal range of motion and neck supple.  Skin:    General: Skin is  warm and dry.     Capillary Refill: Capillary refill takes less than 2 seconds.     Findings: Rash present.     Comments: Diffuse urticaria as well as erythematous papules bilateral upper and lower extremities as well as  Neurological:     General: No focal deficit present.     Mental Status: He is alert and oriented to person, place, and time.     ED Results / Procedures / Treatments   Labs (all labs ordered are listed, but only abnormal results are displayed) Labs Reviewed - No data to display  EKG None  Radiology No results found.  Procedures Procedures    Medications Ordered in ED Medications  predniSONE (DELTASONE) tablet 50 mg (has no administration in time range)    ED Course/ Medical Decision Making/ A&P    36 year old here for evaluation of rash.  He is done by multiple bees approximately 2 weeks ago.  Seen in a virtual visit prescribed Keflex as well as Pepcid and Benadryl.  He continues to have intermittent breakouts.  He is no evidence of anaphylaxis.  He does have diffuse urticaria as well as erythematous papules consistent with contact dermatitis as well as possible drug reaction.  He denies any new lotions, perfumes or detergents.  No new medications.  Rash consistent with contact dermatitis and urticaria. Patient denies any difficulty breathing or swallowing.  Pt has a patent airway without stridor and is handling secretions without difficulty; no angioedema. No blisters, no pustules, no warmth, no draining sinus tracts, no superficial abscesses, no bullous impetigo, no vesicles, no desquamation, no target lesions with dusky purpura or a central bulla. Not tender to touch. No concern for superimposed infection. No concern for SJS, TEN, TSS, tick borne illness, syphilis or other life-threatening condition. Will discharge home with short course of steroids, pepcid and recommend  Hydroxyzine as needed for pruritis.                             Medical Decision  Making Amount and/or Complexity of Data Reviewed Independent Historian: spouse External Data Reviewed: labs, radiology and notes.  Risk OTC drugs. Prescription drug management. Decision regarding hospitalization. Diagnosis or treatment significantly limited by social determinants of health.          Final Clinical Impression(s) / ED Diagnoses Final diagnoses:  Contact dermatitis, unspecified contact dermatitis type, unspecified trigger  Urticaria    Rx / DC Orders ED Discharge Orders          Ordered    predniSONE (DELTASONE) 10 MG tablet  Daily        04/05/22 2105    hydrOXYzine (ATARAX) 25 MG tablet  Every 6 hours        04/05/22 2105    hydrocortisone 2.5 % lotion  2 times daily        04/05/22 2105              Aslee Such A, PA-C 04/05/22 2105    Edwin Dada P, DO 04/05/22 2353

## 2022-04-05 NOTE — ED Triage Notes (Signed)
Pt reports getting stung by multiple bees 2 weeks ago. States bites have not improved and are itchy. Denies pain.

## 2022-04-05 NOTE — ED Notes (Signed)
Pt given a gown to change into.

## 2022-04-06 IMAGING — DX DG LUMBAR SPINE BEND(FLEX/EXT) ONLY 2-3 V
2 series · 2 of 2 positions shown · non-contrast
Comparison: None.

CLINICAL DATA: Chronic back pain.

EXAM:
LUMBAR SPINE FLEX AND EXTEND ONLY - 2-3 VIEW

[l-spine flex]
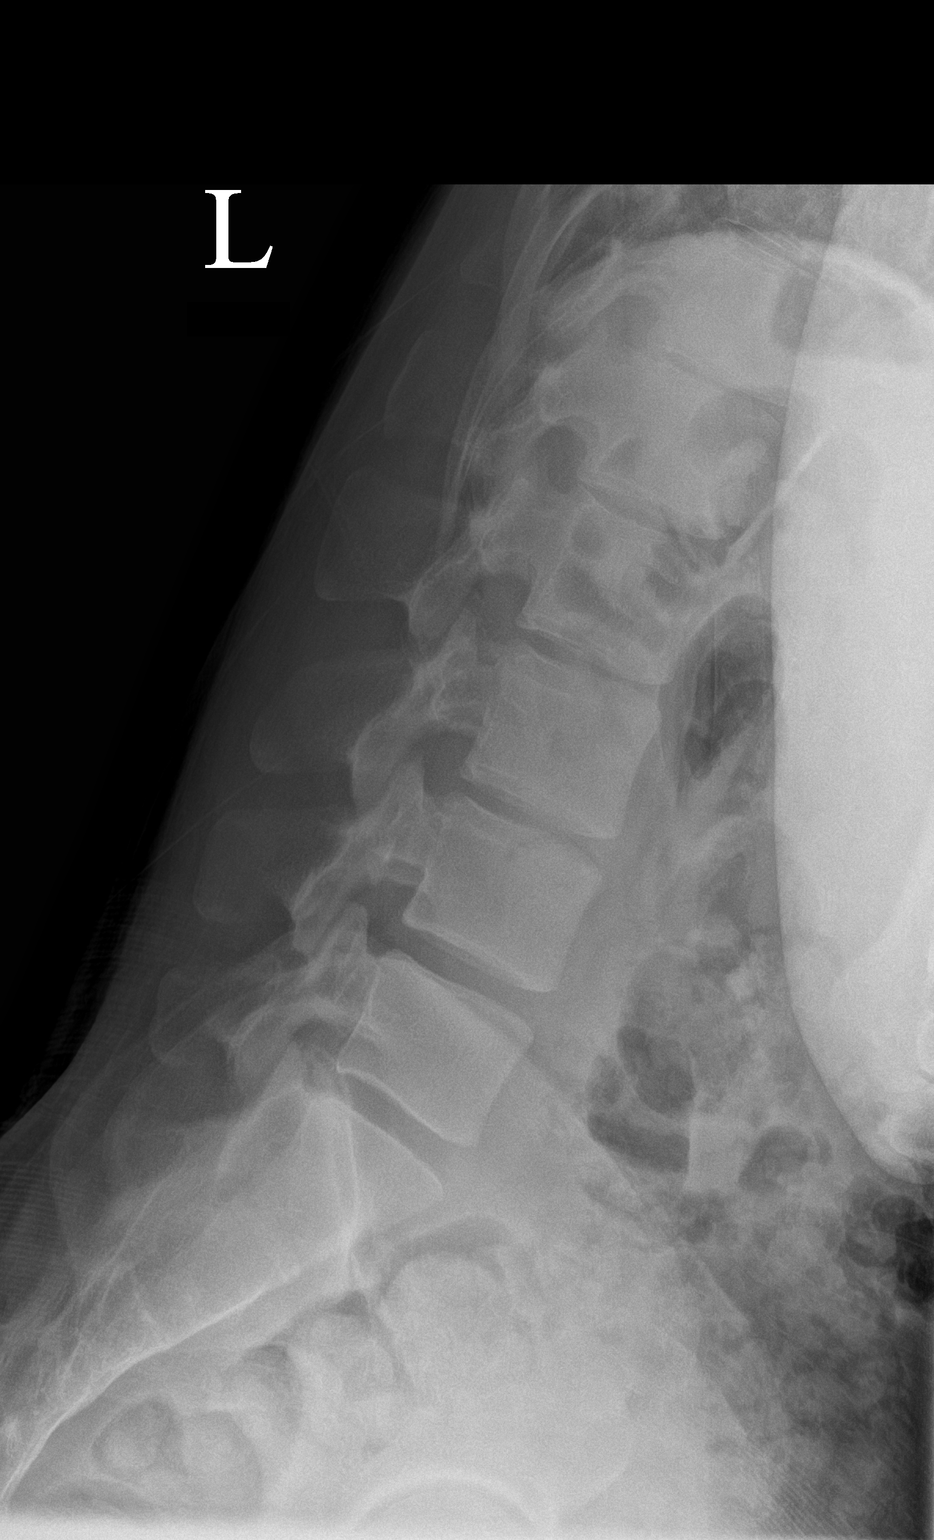

[l-spine ext]
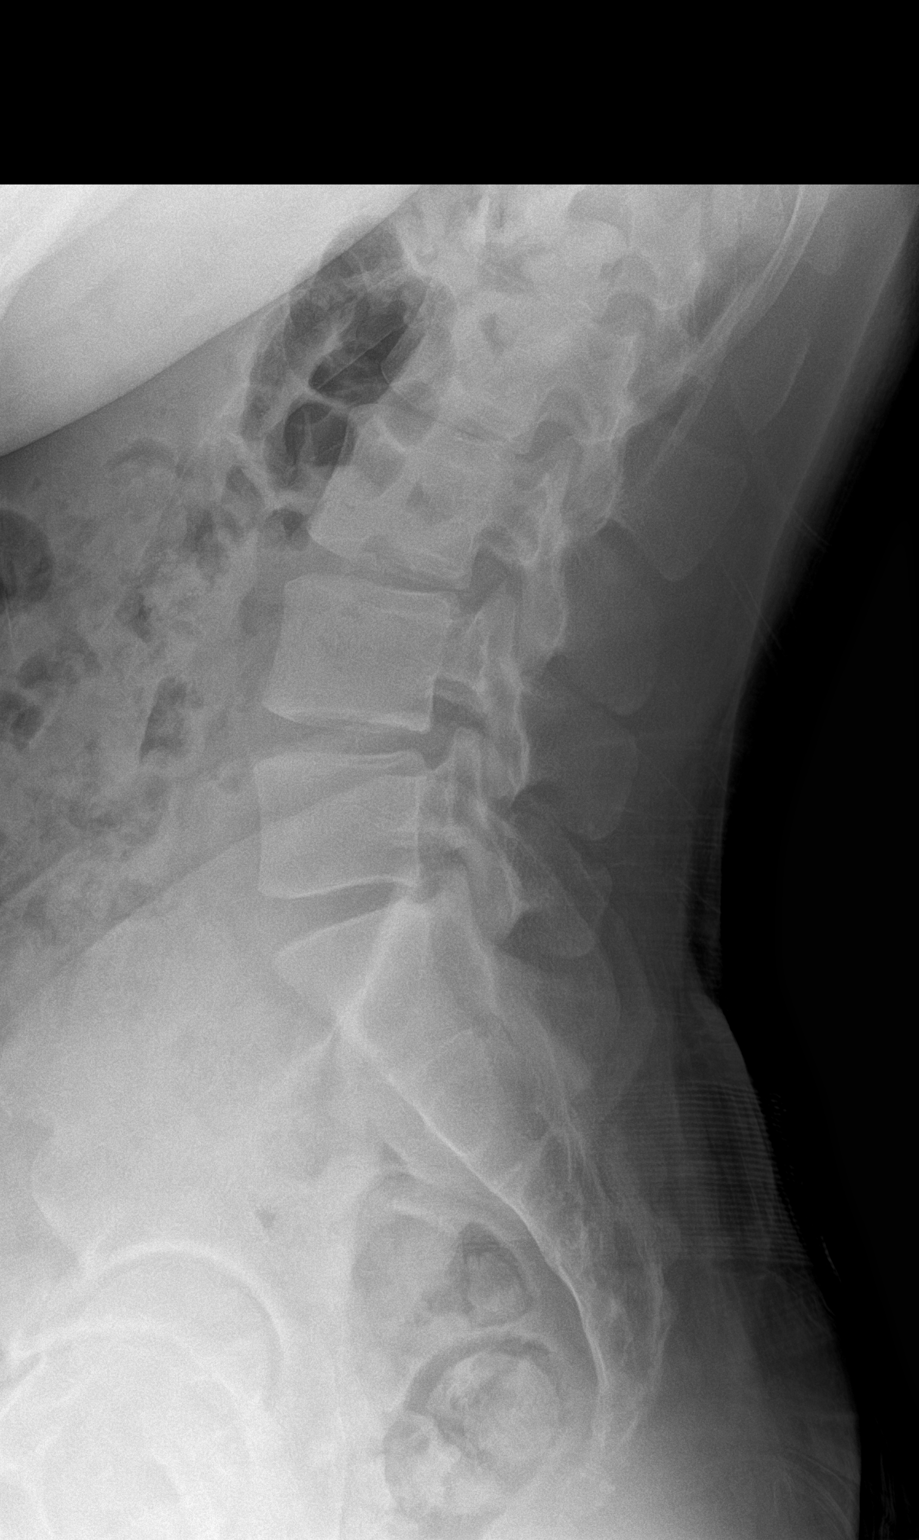

[2 of 2 positions shown; findings below may reference images not displayed]

FINDINGS: There is no evidence of acute fracture or focal bone lesion within
the lumbar spine. Normal alignment is seen on both flexion and
extension images. Intervertebral discs spaces are maintained.
IMPRESSION: Normal appearance of the lumbar spine.

## 2022-04-22 ENCOUNTER — Telehealth: Payer: Self-pay | Admitting: Physician Assistant

## 2022-04-22 DIAGNOSIS — K529 Noninfective gastroenteritis and colitis, unspecified: Secondary | ICD-10-CM

## 2022-04-22 MED ORDER — ONDANSETRON 4 MG PO TBDP: 8.0000 mg | ORAL_TABLET | Freq: Three times a day (TID) | ORAL | 0 refills | Status: AC | PRN

## 2022-04-22 MED ORDER — ONDANSETRON 4 MG PO TBDP: 8.0000 mg | ORAL_TABLET | Freq: Three times a day (TID) | ORAL | 0 refills | Status: DC | PRN

## 2022-04-22 NOTE — Patient Instructions (Signed)
Dennis Moreno, thank you for joining Piedad Climes, PA-C for today's virtual visit.  While this provider is not your primary care provider (PCP), if your PCP is located in our provider database this encounter information will be shared with them immediately following your visit.  Consent: (Patient) Dennis Moreno provided verbal consent for this virtual visit at the beginning of the encounter.  Current Medications:  Current Outpatient Medications:    apixaban (ELIQUIS) 5 MG TABS tablet, Take 2 tablets (10mg ) twice daily for 7 days, then 1 tablet (5mg ) twice daily, Disp: 60 tablet, Rfl: 0   BEE POLLEN PO, Take 5 mLs by mouth daily., Disp: , Rfl:    diphenhydrAMINE (BENADRYL) 25 MG tablet, Take 1 tablet (25 mg total) by mouth every 6 (six) hours as needed., Disp: 30 tablet, Rfl: 0   famotidine (PEPCID) 20 MG tablet, Take 1 tablet (20 mg total) by mouth 2 (two) times daily., Disp: 30 tablet, Rfl: 0   hydrocortisone 2.5 % lotion, Apply topically 2 (two) times daily., Disp: 59 mL, Rfl: 0   hydrOXYzine (ATARAX) 25 MG tablet, Take 1 tablet (25 mg total) by mouth every 6 (six) hours., Disp: 12 tablet, Rfl: 0   Multiple Vitamin (MULTIVITAMIN PO), Take 1 capsule by mouth in the morning and at bedtime. [address], [place], [place], Disp: , Rfl:    Omega-3 Fatty Acids (FISH OIL) 1000 MG CAPS, Take 2 capsules by mouth daily., Disp: , Rfl:    Probiotic Product (PROBIOTIC PO), Take 2 capsules by mouth daily., Disp: , Rfl:    Medications ordered in this encounter:  No orders of the defined types were placed in this encounter.   *If you need refills on other medications prior to your next appointment, please contact your pharmacy*  Follow-Up: Call back or seek an in-person evaluation if the symptoms worsen or if the condition fails to improve as anticipated.  Other Instructions Please keep well-hydrated and get plenty of rest. Use the Zofran as directed for nausea/vomiting. When you  feel up to eating, follow the diet below. If symptoms do not continue to improve or any worsening symptoms, please seek an in-person evaluation. If you note any inability to keep fluids in your body -- ER ASAP. DO NOT DELAY CARE.  Food Choices to Help Relieve Diarrhea, Adult Diarrhea can make you feel weak and cause you to become dehydrated. It is important to choose the right foods and drinks to: Relieve diarrhea. Replace lost fluids and nutrients. Prevent dehydration. What are tips for following this plan? Relieving diarrhea Avoid foods that make your diarrhea worse. These may include: Foods and beverages sweetened with high-fructose corn syrup, honey, or sweeteners such as xylitol, sorbitol, and mannitol. Fried, greasy, or spicy foods. Raw fruits and vegetables. Eat foods that are rich in probiotics. These include foods such as yogurt and fermented milk products. Probiotics can help increase healthy bacteria in your stomach and intestines (gastrointestinal tract or GI tract). This may help digestion and stop diarrhea. If you have lactose intolerance, avoid dairy products. These may make your diarrhea worse. Take medicine to help stop diarrhea only as told by your health care provider. Replacing nutrients  Eat bland, easy-to-digest foods in small amounts as you are able, until your diarrhea starts to get better. These foods include bananas, applesauce, rice, toast, and crackers. Gradually reintroduce nutrient-rich foods as tolerated or as told by your health care provider. This includes: Well-cooked protein foods, such as eggs, lean meats like  fish or chicken without skin, and tofu. Peeled, seeded, and soft-cooked fruits and vegetables. Low-fat dairy products. Whole grains. Take vitamin and mineral supplements as told by your health care provider. Preventing dehydration  Start by sipping water or a solution to prevent dehydration (oral rehydration solution, ORS). This is a drink that  helps replace fluids and minerals your body has lost. You can buy an ORS at pharmacies and retail stores. Try to drink at least 8-10 cups (2,000-2,500 mL) of fluid each day to help replace lost fluids. If you have urine that is pale yellow, you are getting enough fluids. You may drink other liquids in addition to water, such as fruit juice that you have added water to (diluted fruit juice) or low-calorie sports drinks, as tolerated or as told by your health care provider. Avoid drinks with caffeine, such as coffee, tea, or soft drinks. Avoid alcohol. Summary When you have diarrhea, it is important to choose the right foods and drinks to relieve diarrhea, to replace lost fluids and nutrients, and to prevent dehydration. Make sure you drink enough fluid to keep your urine pale yellow. You may benefit from eating bland foods at first. Gradually reintroduce healthy, nutrient-rich foods as tolerated or as told by your health care provider. Avoid foods that make your diarrhea worse, such as fried, greasy, or spicy foods. This information is not intended to replace advice given to you by your health care provider. Make sure you discuss any questions you have with your health care provider. Document Revised: 11/20/2021 Document Reviewed: 10/16/2019 Elsevier Patient Education  2023 Elsevier Inc.    If you have been instructed to have an in-person evaluation today at a local Urgent Care facility, please use the link below. It will take you to a list of all of our available [facility], including address, phone number and hours of operation. Please do not delay care.  [facility]  If you or a family member do not have a primary care provider, use the link below to schedule a visit and establish care. When you choose a [facility] primary care physician or advanced practice provider, you gain a long-term partner in health. Find a Primary Care Provider  Learn more about [facility]'s in-office and virtual care options: [facility] - Get Care Now

## 2022-04-22 NOTE — Progress Notes (Signed)
Virtual Visit Consent   Dennis Moreno, you are scheduled for a virtual visit with a [facility] provider today. Just as with appointments in the office, your consent must be obtained to participate. Your consent will be active for this visit and any virtual visit you may have with one of our providers in the next 365 days. If you have a MyChart account, a copy of this consent can be sent to you electronically.  As this is a virtual visit, video technology does not allow for your provider to perform a traditional examination. This may limit your provider's ability to fully assess your condition. If your provider identifies any concerns that need to be evaluated in person or the need to arrange testing (such as labs, EKG, etc.), we will make arrangements to do so. Although advances in technology are sophisticated, we cannot ensure that it will always work on either your end or our end. If the connection with a video visit is poor, the visit may have to be switched to a telephone visit. With either a video or telephone visit, we are not always able to ensure that we have a secure connection.  By engaging in this virtual visit, you consent to the provision of healthcare and authorize for your insurance to be billed (if applicable) for the services provided during this visit. Depending on your insurance coverage, you may receive a charge related to this service.  I need to obtain your verbal consent now. Are you willing to proceed with your visit today? Dennis Moreno has provided verbal consent on 04/22/2022 for a virtual visit (video or telephone). Piedad Climes, [place]  Date: 04/22/2022 2:35 PM  Virtual Visit via Video Note   I, Piedad Climes, connected with  Dennis Moreno  ([id number], Aug 04, 1986) on 04/22/22 at  2:30 PM EDT by a video-enabled telemedicine application and verified that I am speaking with the correct person using two identifiers.  Location: Patient: Virtual Visit  Location Patient: Home Provider: Virtual Visit Location Provider: Home Office   I discussed the limitations of evaluation and management by telemedicine and the availability of in person appointments. The patient expressed understanding and agreed to proceed.    History of Present Illness: Dennis Moreno is a 36 y.o. who identifies as a male who was assigned male at birth, and is being seen today for vomiting and diarrhea since early this morning. Denies fever, chills. Denies melena or hematochezia. Some abdominal cramping noted. Has been trying to keep hydrated. Vomiting has resolved but now with dry heaves. Denies decreased urinary output. Denies recent travel or sick contact.   HPI: HPI  Problems:  Patient Active Problem List   Diagnosis Date Noted   Rhabdomyolysis 11/20/2020   Low back pain 04/18/2020   Depression, major, single episode, moderate (HCC) 10/10/2017   Anxiety 10/10/2017    Allergies:  Allergies  Allergen Reactions   Hydrocodone Itching   Medications:  Current Outpatient Medications:    apixaban (ELIQUIS) 5 MG TABS tablet, Take 2 tablets (10mg ) twice daily for 7 days, then 1 tablet (5mg ) twice daily, Disp: 60 tablet, Rfl: 0   BEE POLLEN PO, Take 5 mLs by mouth daily., Disp: , Rfl:    diphenhydrAMINE (BENADRYL) 25 MG tablet, Take 1 tablet (25 mg total) by mouth every 6 (six) hours as needed., Disp: 30 tablet, Rfl: 0   famotidine (PEPCID) 20 MG tablet, Take 1 tablet (20 mg total) by mouth 2 (two) times daily., Disp: 30  tablet, Rfl: 0   hydrocortisone 2.5 % lotion, Apply topically 2 (two) times daily., Disp: 59 mL, Rfl: 0   hydrOXYzine (ATARAX) 25 MG tablet, Take 1 tablet (25 mg total) by mouth every 6 (six) hours., Disp: 12 tablet, Rfl: 0   Multiple Vitamin (MULTIVITAMIN PO), Take 1 capsule by mouth in the morning and at bedtime. [address] [place], [place], Disp: , Rfl:    Omega-3 Fatty Acids (FISH OIL) 1000 MG CAPS, Take 2 capsules by mouth daily., Disp: ,  Rfl:    ondansetron (ZOFRAN-ODT) 4 MG disintegrating tablet, Take 2 tablets (8 mg total) by mouth every 8 (eight) hours as needed for nausea or vomiting., Disp: 20 tablet, Rfl: 0   Probiotic Product (PROBIOTIC PO), Take 2 capsules by mouth daily., Disp: , Rfl:   Observations/Objective: Patient is well-developed, well-nourished in no acute distress.  Resting comfortably at home.  Head is normocephalic, atraumatic.  No labored breathing. Speech is clear and coherent with logical content.  Patient is alert and oriented at baseline.   Assessment and Plan: 1. Gastroenteritis - ondansetron (ZOFRAN-ODT) 4 MG disintegrating tablet; Take 2 tablets (8 mg total) by mouth every 8 (eight) hours as needed for nausea or vomiting.  Dispense: 20 tablet; Refill: 0  Likely viral. Afebrile. No alarm signs or symptoms present. Supportive measures and OTC medications reviewed. Start [employer] Zofran per orders. Work note provided. Strict ER precautions reviewed with patient.   Follow Up Instructions: I discussed the assessment and treatment plan with the patient. The patient was provided an opportunity to ask questions and all were answered. The patient agreed with the plan and demonstrated an understanding of the instructions.  A copy of instructions were sent to the patient via MyChart unless otherwise noted below.   The patient was advised to call back or seek an in-person evaluation if the symptoms worsen or if the condition fails to improve as anticipated.  Time:  I spent 10 minutes with the patient via telehealth technology discussing the above problems/concerns.    Piedad Climes, PA-C

## 2022-04-24 ENCOUNTER — Telehealth: Payer: Self-pay | Admitting: Nurse Practitioner

## 2022-04-24 DIAGNOSIS — A084 Viral intestinal infection, unspecified: Secondary | ICD-10-CM

## 2022-04-24 NOTE — Progress Notes (Signed)
Virtual Visit Consent   TRIP CAVANAGH, you are scheduled for a virtual visit with a [facility] provider today. Just as with appointments in the office, your consent must be obtained to participate. Your consent will be active for this visit and any virtual visit you may have with one of our providers in the next 365 days. If you have a MyChart account, a copy of this consent can be sent to you electronically.  As this is a virtual visit, video technology does not allow for your provider to perform a traditional examination. This may limit your provider's ability to fully assess your condition. If your provider identifies any concerns that need to be evaluated in person or the need to arrange testing (such as labs, EKG, etc.), we will make arrangements to do so. Although advances in technology are sophisticated, we cannot ensure that it will always work on either your end or our end. If the connection with a video visit is poor, the visit may have to be switched to a telephone visit. With either a video or telephone visit, we are not always able to ensure that we have a secure connection.  By engaging in this virtual visit, you consent to the provision of healthcare and authorize for your insurance to be billed (if applicable) for the services provided during this visit. Depending on your insurance coverage, you may receive a charge related to this service.  I need to obtain your verbal consent now. Are you willing to proceed with your visit today? Dennis Moreno has provided verbal consent on 04/24/2022 for a virtual visit (video or telephone). Dennis Rigg, NP  Date: 04/24/2022 8:09 AM  Virtual Visit via Video Note   I, Dennis Moreno, connected with  Dennis Moreno  ([id number], 01/10/1986) on 04/24/22 at  8:00 AM EDT by a video-enabled telemedicine application and verified that I am speaking with the correct person using two identifiers.  Location: Patient: Virtual Visit Location  Patient: Mobile Provider: Virtual Visit Location Provider: Home Office   I discussed the limitations of evaluation and management by telemedicine and the availability of in person appointments. The patient expressed understanding and agreed to proceed.    History of Present Illness: Dennis Moreno is a 36 y.o. who identifies as a male who was assigned male at birth, and is being seen today for F/U to gastroenteritis.  [name] was treated for gastroenteritis with zofran via virtual visit on 04-22-2022. He was given a work note for 8-9 and 8-10. Today he states his symptoms persisted through most of the day and night on 8-11 and he missed work due to not feeling with symptoms of nausea, vomiting and diarrhea. He is requesting a work note for 04-23-2022. States he feels much better today and a work note isn't needed. I did let him know with him being out for 3 days we do not fill out any paperwork for employers and he would need to have a PCP fill out leave papers for work if needed.    Problems:  Patient Active Problem List   Diagnosis Date Noted   Rhabdomyolysis 11/20/2020   Low back pain 04/18/2020   Depression, major, single episode, moderate (HCC) 10/10/2017   Anxiety 10/10/2017    Allergies:  Allergies  Allergen Reactions   Hydrocodone Itching   Medications:  Current Outpatient Medications:    apixaban (ELIQUIS) 5 MG TABS tablet, Take 2 tablets (10mg ) twice daily for 7 days, then 1 tablet (  5mg ) twice daily, Disp: 60 tablet, Rfl: 0   BEE POLLEN PO, Take 5 mLs by mouth daily., Disp: , Rfl:    diphenhydrAMINE (BENADRYL) 25 MG tablet, Take 1 tablet (25 mg total) by mouth every 6 (six) hours as needed., Disp: 30 tablet, Rfl: 0   famotidine (PEPCID) 20 MG tablet, Take 1 tablet (20 mg total) by mouth 2 (two) times daily., Disp: 30 tablet, Rfl: 0   hydrocortisone 2.5 % lotion, Apply topically 2 (two) times daily., Disp: 59 mL, Rfl: 0   hydrOXYzine (ATARAX) 25 MG tablet, Take 1 tablet (25  mg total) by mouth every 6 (six) hours., Disp: 12 tablet, Rfl: 0   Multiple Vitamin (MULTIVITAMIN PO), Take 1 capsule by mouth in the morning and at bedtime. [address], [place], [place], Disp: , Rfl:    Omega-3 Fatty Acids (FISH OIL) 1000 MG CAPS, Take 2 capsules by mouth daily., Disp: , Rfl:    ondansetron (ZOFRAN-ODT) 4 MG disintegrating tablet, Take 2 tablets (8 mg total) by mouth every 8 (eight) hours as needed for nausea or vomiting., Disp: 20 tablet, Rfl: 0   Probiotic Product (PROBIOTIC PO), Take 2 capsules by mouth daily., Disp: , Rfl:   Observations/Objective: Patient is well-developed, well-nourished in no acute distress.  Resting comfortably in passenger seat of car Head is normocephalic, atraumatic.  No labored breathing.  Speech is clear and coherent with logical content.  Patient is alert and oriented at baseline.    Assessment and Plan: 1. Viral gastroenteritis SYMPTOMS RESOLVED  Follow Up Instructions: I discussed the assessment and treatment plan with the patient. The patient was provided an opportunity to ask questions and all were answered. The patient agreed with the plan and demonstrated an understanding of the instructions.  A copy of instructions were sent to the patient via MyChart unless otherwise noted below.    The patient was advised to call back or seek an in-person evaluation if the symptoms worsen or if the condition fails to improve as anticipated.  Time:  I spent 11 minutes with the patient via telehealth technology discussing the above problems/concerns.    [place], NP

## 2022-04-24 NOTE — Patient Instructions (Signed)
  Dennis Moreno, thank you for joining [name], NP for today's virtual visit.  While this provider is not your primary care provider (PCP), if your PCP is located in our provider database this encounter information will be shared with them immediately following your visit.  Consent: (Patient) Dennis Moreno provided verbal consent for this virtual visit at the beginning of the encounter.  Current Medications:  Current Outpatient Medications:    apixaban (ELIQUIS) 5 MG TABS tablet, Take 2 tablets (10mg ) twice daily for 7 days, then 1 tablet (5mg ) twice daily, Disp: 60 tablet, Rfl: 0   BEE POLLEN PO, Take 5 mLs by mouth daily., Disp: , Rfl:    diphenhydrAMINE (BENADRYL) 25 MG tablet, Take 1 tablet (25 mg total) by mouth every 6 (six) hours as needed., Disp: 30 tablet, Rfl: 0   famotidine (PEPCID) 20 MG tablet, Take 1 tablet (20 mg total) by mouth 2 (two) times daily., Disp: 30 tablet, Rfl: 0   hydrocortisone 2.5 % lotion, Apply topically 2 (two) times daily., Disp: 59 mL, Rfl: 0   hydrOXYzine (ATARAX) 25 MG tablet, Take 1 tablet (25 mg total) by mouth every 6 (six) hours., Disp: 12 tablet, Rfl: 0   Multiple Vitamin (MULTIVITAMIN PO), Take 1 capsule by mouth in the morning and at bedtime. [address] [place], [place], Disp: , Rfl:    Omega-3 Fatty Acids (FISH OIL) 1000 MG CAPS, Take 2 capsules by mouth daily., Disp: , Rfl:    ondansetron (ZOFRAN-ODT) 4 MG disintegrating tablet, Take 2 tablets (8 mg total) by mouth every 8 (eight) hours as needed for nausea or vomiting., Disp: 20 tablet, Rfl: 0   Probiotic Product (PROBIOTIC PO), Take 2 capsules by mouth daily., Disp: , Rfl:    Medications ordered in this encounter:  No orders of the defined types were placed in this encounter.    *If you need refills on other medications prior to your next appointment, please contact your pharmacy*  Follow-Up: Call back or seek an in-person evaluation if the symptoms worsen or if the  condition fails to improve as anticipated.  Other Instructions Symptoms resolved   If you have been instructed to have an in-person evaluation today at a local Urgent Care facility, please use the link below. It will take you to a list of all of our available [facility], including address, phone number and hours of operation. Please do not delay care.  [facility]  If you or a family member do not have a primary care provider, use the link below to schedule a visit and establish care. When you choose a [facility] primary care physician or advanced practice provider, you gain a long-term partner in health. Find a Primary Care Provider  Learn more about [facility] in-office and virtual care options: [facility] - Get Care Now

## 2022-05-04 ENCOUNTER — Telehealth: Payer: Self-pay | Admitting: Nurse Practitioner

## 2022-05-04 DIAGNOSIS — F5101 Primary insomnia: Secondary | ICD-10-CM

## 2022-05-04 MED ORDER — HYDROXYZINE PAMOATE 25 MG PO CAPS: 25.0000 mg | ORAL_CAPSULE | Freq: Every evening | ORAL | 0 refills | Status: AC | PRN

## 2022-05-04 NOTE — Progress Notes (Signed)
Virtual Visit Consent   Dennis Moreno, you are scheduled for a virtual visit with a [facility] provider today. Just as with appointments in the office, your consent must be obtained to participate. Your consent will be active for this visit and any virtual visit you may have with one of our providers in the next 365 days. If you have a MyChart account, a copy of this consent can be sent to you electronically.  As this is a virtual visit, video technology does not allow for your provider to perform a traditional examination. This may limit your provider's ability to fully assess your condition. If your provider identifies any concerns that need to be evaluated in person or the need to arrange testing (such as labs, EKG, etc.), we will make arrangements to do so. Although advances in technology are sophisticated, we cannot ensure that it will always work on either your end or our end. If the connection with a video visit is poor, the visit may have to be switched to a telephone visit. With either a video or telephone visit, we are not always able to ensure that we have a secure connection.  By engaging in this virtual visit, you consent to the provision of healthcare and authorize for your insurance to be billed (if applicable) for the services provided during this visit. Depending on your insurance coverage, you may receive a charge related to this service.  I need to obtain your verbal consent now. Are you willing to proceed with your visit today? Dennis Moreno has provided verbal consent on 05/04/2022 for a virtual visit (video or telephone). Viviano Simas, FNP  Date: 05/04/2022 11:54 AM  Virtual Visit via Video Note   I, Viviano Simas, connected with  Dennis Moreno  ([id number], [birth year]) on 05/04/22 at 11:45 AM EDT by a video-enabled telemedicine application and verified that I am speaking with the correct person using two identifiers.  Location: Patient: Virtual Visit Location  Patient: Home Provider: Virtual Visit Location Provider: Home Office   I discussed the limitations of evaluation and management by telemedicine and the availability of in person appointments. The patient expressed understanding and agreed to proceed.    History of Present Illness: Dennis Moreno is a 36 y.o. who identifies as a male who was assigned male at birth, and is being seen today for sleep disturbances.   He has been having a hard time falling asleep and staying asleep. Then he has a hard time getting up in the morning and getting things done the next morning.   He has not tried any medications for sleep.   He does drink alcohol maybe 1 drink to try to help with sleep without success, he does not try this often.   He does use marijuana and that is more in the evenings.   Problems:  Patient Active Problem List   Diagnosis Date Noted   Rhabdomyolysis 11/20/2020   Low back pain 04/18/2020   Depression, major, single episode, moderate (HCC) 10/10/2017   Anxiety 10/10/2017    Allergies:  Allergies  Allergen Reactions   Hydrocodone Itching   Medications:  Current Outpatient Medications:    apixaban (ELIQUIS) 5 MG TABS tablet, Take 2 tablets (10mg ) twice daily for 7 days, then 1 tablet (5mg ) twice daily, Disp: 60 tablet, Rfl: 0   BEE POLLEN PO, Take 5 mLs by mouth daily., Disp: , Rfl:    diphenhydrAMINE (BENADRYL) 25 MG tablet, Take 1 tablet (25 mg total) by mouth  every 6 (six) hours as needed., Disp: 30 tablet, Rfl: 0   famotidine (PEPCID) 20 MG tablet, Take 1 tablet (20 mg total) by mouth 2 (two) times daily., Disp: 30 tablet, Rfl: 0   hydrocortisone 2.5 % lotion, Apply topically 2 (two) times daily., Disp: 59 mL, Rfl: 0   hydrOXYzine (ATARAX) 25 MG tablet, Take 1 tablet (25 mg total) by mouth every 6 (six) hours., Disp: 12 tablet, Rfl: 0   Multiple Vitamin (MULTIVITAMIN PO), Take 1 capsule by mouth in the morning and at bedtime. [address], [place], [place], Disp: ,  Rfl:    Omega-3 Fatty Acids (FISH OIL) 1000 MG CAPS, Take 2 capsules by mouth daily., Disp: , Rfl:    ondansetron (ZOFRAN-ODT) 4 MG disintegrating tablet, Take 2 tablets (8 mg total) by mouth every 8 (eight) hours as needed for nausea or vomiting., Disp: 20 tablet, Rfl: 0   Probiotic Product (PROBIOTIC PO), Take 2 capsules by mouth daily., Disp: , Rfl:   Observations/Objective: Patient is well-developed, well-nourished in no acute distress.  Resting comfortably at home.  Head is normocephalic, atraumatic.  No labored breathing.  Speech is clear and coherent with logical content.  Patient is alert and oriented at baseline.    Assessment and Plan: 1. Primary insomnia Advised stopping marijuana use as it is likely impairing sleep.  Sent education on sleep hygiene  May try hydroxyzine for sleep assistance   - hydrOXYzine (VISTARIL) 25 MG capsule; Take 1 capsule (25 mg total) by mouth at bedtime as needed (insomnia).  Dispense: 30 capsule; Refill: 0     Follow Up Instructions: I discussed the assessment and treatment plan with the patient. The patient was provided an opportunity to ask questions and all were answered. The patient agreed with the plan and demonstrated an understanding of the instructions.  A copy of instructions were sent to the patient via MyChart unless otherwise noted below.    The patient was advised to call back or seek an in-person evaluation if the symptoms worsen or if the condition fails to improve as anticipated.  Time:  I spent  10 minutes discussing  the above problems/concerns.    Viviano Simas, FNP

## 2022-05-27 ENCOUNTER — Telehealth: Payer: Self-pay | Admitting: Family Medicine

## 2022-05-27 ENCOUNTER — Telehealth: Payer: Self-pay | Admitting: Physician Assistant

## 2022-05-27 DIAGNOSIS — B9689 Other specified bacterial agents as the cause of diseases classified elsewhere: Secondary | ICD-10-CM

## 2022-05-27 DIAGNOSIS — U071 COVID-19: Secondary | ICD-10-CM

## 2022-05-27 DIAGNOSIS — J019 Acute sinusitis, unspecified: Secondary | ICD-10-CM

## 2022-05-27 MED ORDER — AMOXICILLIN-POT CLAVULANATE 875-125 MG PO TABS
1.0000 | ORAL_TABLET | Freq: Two times a day (BID) | ORAL | 0 refills | Status: AC
Start: 2022-05-27 — End: 2022-06-03

## 2022-05-27 NOTE — Progress Notes (Signed)
   Thank you for the details you included in the comment boxes. Those details are very helpful in determining the best course of treatment for you and help [place] to provide the best care.Because of noting symptom onset 8/29 and still substantial symptoms, we recommend that you convert this visit to a video visit in order for the provider to better assess what is going on.  The provider will be able to give you a more accurate diagnosis and treatment plan if we can more freely discuss your symptoms and with the addition of a virtual examination.   If you convert to a video visit, we will bill your insurance (similar to an office visit) and you will not be charged for this e-Visit. You will be able to stay at home and speak with the first available [facility] advanced practice provider. The link to do a video visit is in the drop down Menu tab of your Welcome screen in MyChart.

## 2022-05-27 NOTE — Progress Notes (Signed)
Virtual Visit Consent   Dennis Moreno, you are scheduled for a virtual visit with a [place] [facility] provider today. Just as with appointments in the office, your consent must be obtained to participate. Your consent will be active for this visit and any virtual visit you may have with one of our providers in the next 365 days. If you have a MyChart account, a copy of this consent can be sent to you electronically.  As this is a virtual visit, video technology does not allow for your provider to perform a traditional examination. This may limit your provider's ability to fully assess your condition. If your provider identifies any concerns that need to be evaluated in person or the need to arrange testing (such as labs, EKG, etc.), we will make arrangements to do so. Although advances in technology are sophisticated, we cannot ensure that it will always work on either your end or our end. If the connection with a video visit is poor, the visit may have to be switched to a telephone visit. With either a video or telephone visit, we are not always able to ensure that we have a secure connection.  By engaging in this virtual visit, you consent to the provision of healthcare and authorize for your insurance to be billed (if applicable) for the services provided during this visit. Depending on your insurance coverage, you may receive a charge related to this service.  I need to obtain your verbal consent now. Are you willing to proceed with your visit today? Dennis Moreno has provided verbal consent on 05/27/2022 for a virtual visit (video or telephone). Freddy Finner, NP  Date: 05/27/2022 11:34 AM  Virtual Visit via Video Note   I, Freddy Finner, connected with  Dennis Moreno  ([id number], [birth year]) on 05/27/22 at 11:30 AM EDT by a video-enabled telemedicine application and verified that I am speaking with the correct person using two identifiers.  Location: Patient: Virtual Visit Location  Patient: Home Provider: Virtual Visit Location Provider: Home Office   I discussed the limitations of evaluation and management by telemedicine and the availability of in person appointments. The patient expressed understanding and agreed to proceed.    History of Present Illness: Dennis Moreno is a 36 y.o. who identifies as a male who was assigned male at birth, and is being seen today for sinus symptoms.   05/11/2022- with stomach like symptoms, and baby developed covid and ended up testing positive on 05/16/2022. Reports chills, fevers t max 102, vomiting and coughing with sore throat. Does report some improvement within 5 days of testing positive. Still has diarrhea, some headache- sinus pressure and thick drainage, and feeling weak and sometimes shortness of breath- but reported some smoking prior to covid and has stopped smoking since having covid. Denies fevers, chills, chest pain, ear or throat pain.   Problems:  Patient Active Problem List   Diagnosis Date Noted   Rhabdomyolysis 11/20/2020   Low back pain 04/18/2020   Depression, major, single episode, moderate (HCC) 10/10/2017   Anxiety 10/10/2017    Allergies:  Allergies  Allergen Reactions   Hydrocodone Itching   Medications:  Current Outpatient Medications:    apixaban (ELIQUIS) 5 MG TABS tablet, Take 2 tablets (10mg ) twice daily for 7 days, then 1 tablet (5mg ) twice daily, Disp: 60 tablet, Rfl: 0   BEE POLLEN PO, Take 5 mLs by mouth daily., Disp: , Rfl:    famotidine (PEPCID) 20 MG tablet, Take 1 tablet (  20 mg total) by mouth 2 (two) times daily., Disp: 30 tablet, Rfl: 0   hydrocortisone 2.5 % lotion, Apply topically 2 (two) times daily., Disp: 59 mL, Rfl: 0   hydrOXYzine (ATARAX) 25 MG tablet, Take 1 tablet (25 mg total) by mouth every 6 (six) hours., Disp: 12 tablet, Rfl: 0   hydrOXYzine (VISTARIL) 25 MG capsule, Take 1 capsule (25 mg total) by mouth at bedtime as needed (insomnia)., Disp: 30 capsule, Rfl: 0    Multiple Vitamin (MULTIVITAMIN PO), Take 1 capsule by mouth in the morning and at bedtime. [address], [place], [place], Disp: , Rfl:    Omega-3 Fatty Acids (FISH OIL) 1000 MG CAPS, Take 2 capsules by mouth daily., Disp: , Rfl:    ondansetron (ZOFRAN-ODT) 4 MG disintegrating tablet, Take 2 tablets (8 mg total) by mouth every 8 (eight) hours as needed for nausea or vomiting., Disp: 20 tablet, Rfl: 0   Probiotic Product (PROBIOTIC PO), Take 2 capsules by mouth daily., Disp: , Rfl:   Observations/Objective: Patient is well-developed, well-nourished in no acute distress.  Resting comfortably  at home.  Head is normocephalic, atraumatic.  No labored breathing.  Speech is clear and coherent with logical content.  Patient is alert and oriented at baseline.    Assessment and Plan: 1. Acute bacterial sinusitis  - amoxicillin-clavulanate (AUGMENTIN) 875-125 MG tablet; Take 1 tablet by mouth 2 (two) times daily for 7 days.  Dispense: 14 tablet; Refill: 0  -rest -hydrate -OTC reviewed -take medication as ordered   Reviewed side effects, risks and benefits of medication.    Patient acknowledged agreement and understanding of the plan.   Past Medical, Surgical, Social History, Allergies, and Medications have been Reviewed.    Follow Up Instructions: I discussed the assessment and treatment plan with the patient. The patient was provided an opportunity to ask questions and all were answered. The patient agreed with the plan and demonstrated an understanding of the instructions.  A copy of instructions were sent to the patient via MyChart unless otherwise noted below.    The patient was advised to call back or seek an in-person evaluation if the symptoms worsen or if the condition fails to improve as anticipated.  Time:  I spent 15 minutes with the patient via telehealth technology discussing the above problems/concerns.    Freddy Finner, NP

## 2022-05-27 NOTE — Patient Instructions (Signed)
Dennis Moreno, thank you for joining Freddy Finner, NP for today's virtual visit.  While this provider is not your primary care provider (PCP), if your PCP is located in our provider database this encounter information will be shared with them immediately following your visit.  Consent: (Patient) Dennis Moreno provided verbal consent for this virtual visit at the beginning of the encounter.  Current Medications:  Current Outpatient Medications:    amoxicillin-clavulanate (AUGMENTIN) 875-125 MG tablet, Take 1 tablet by mouth 2 (two) times daily for 7 days., Disp: 14 tablet, Rfl: 0   apixaban (ELIQUIS) 5 MG TABS tablet, Take 2 tablets (10mg ) twice daily for 7 days, then 1 tablet (5mg ) twice daily, Disp: 60 tablet, Rfl: 0   BEE POLLEN PO, Take 5 mLs by mouth daily., Disp: , Rfl:    famotidine (PEPCID) 20 MG tablet, Take 1 tablet (20 mg total) by mouth 2 (two) times daily., Disp: 30 tablet, Rfl: 0   hydrocortisone 2.5 % lotion, Apply topically 2 (two) times daily., Disp: 59 mL, Rfl: 0   hydrOXYzine (ATARAX) 25 MG tablet, Take 1 tablet (25 mg total) by mouth every 6 (six) hours., Disp: 12 tablet, Rfl: 0   hydrOXYzine (VISTARIL) 25 MG capsule, Take 1 capsule (25 mg total) by mouth at bedtime as needed (insomnia)., Disp: 30 capsule, Rfl: 0   Multiple Vitamin (MULTIVITAMIN PO), Take 1 capsule by mouth in the morning and at bedtime. [address], [place], [place], Disp: , Rfl:    Omega-3 Fatty Acids (FISH OIL) 1000 MG CAPS, Take 2 capsules by mouth daily., Disp: , Rfl:    ondansetron (ZOFRAN-ODT) 4 MG disintegrating tablet, Take 2 tablets (8 mg total) by mouth every 8 (eight) hours as needed for nausea or vomiting., Disp: 20 tablet, Rfl: 0   Probiotic Product (PROBIOTIC PO), Take 2 capsules by mouth daily., Disp: , Rfl:    Medications ordered in this encounter:  Meds ordered this encounter  Medications   amoxicillin-clavulanate (AUGMENTIN) 875-125 MG tablet    Sig: Take 1 tablet by  mouth 2 (two) times daily for 7 days.    Dispense:  14 tablet    Refill:  0    Order Specific Question:   Supervising Provider    Answer:   [address] [place]     *If you need refills on other medications prior to your next appointment, please contact your pharmacy*  Follow-Up: Call back or seek an in-person evaluation if the symptoms worsen or if the condition fails to improve as anticipated.  Other Instructions Sinus Infection, Adult A sinus infection is soreness and swelling (inflammation) of your sinuses. Sinuses are hollow spaces in the bones around your face. They are located: Around your eyes. In the middle of your forehead. Behind your nose. In your cheekbones. Your sinuses and nasal passages are lined with a fluid called mucus. Mucus drains out of your sinuses. Swelling can trap mucus in your sinuses. This lets germs (bacteria, virus, or fungus) grow, which leads to infection. Most of the time, this condition is caused by a virus. What are the causes? Allergies. Asthma. Germs. Things that block your nose or sinuses. Growths in the nose (nasal polyps). Chemicals or irritants in the air. A fungus. This is rare. What increases the risk? Having a weak body defense system (immune system). Doing a lot of swimming or diving. Using nasal sprays too much. Smoking. What are the signs or symptoms? The main symptoms of this condition are pain  and a feeling of pressure around the sinuses. Other symptoms include: Stuffy nose (congestion). This may make it hard to breathe through your nose. Runny nose (drainage). Soreness, swelling, and warmth in the sinuses. A cough that may get worse at night. Being unable to smell and taste. Mucus that collects in the throat or the back of the nose (postnasal drip). This may cause a sore throat or bad breath. Being very tired (fatigued). A fever. How is this diagnosed? Your symptoms. Your medical history. A physical exam. Tests to  find out if your condition is short-term (acute) or long-term (chronic). Your doctor may: Check your nose for growths (polyps). Check your sinuses using a tool that has a light on one end (endoscope). Check for allergies or germs. Do imaging tests, such as an MRI or CT scan. How is this treated? Treatment for this condition depends on the cause and whether it is short-term or long-term. If caused by a virus, your symptoms should go away on their own within 10 days. You may be given medicines to relieve symptoms. They include: Medicines that shrink swollen tissue in the nose. A spray that treats swelling of the nostrils. Rinses that help get rid of thick mucus in your nose (nasal saline washes). Medicines that treat allergies (antihistamines). Over-the-counter pain relievers. If caused by bacteria, your doctor may wait to see if you will get better without treatment. You may be given antibiotic medicine if you have: A very bad infection. A weak body defense system. If caused by growths in the nose, surgery may be needed. Follow these instructions at home: Medicines Take, use, or apply over-the-counter and prescription medicines only as told by your doctor. These may include nasal sprays. If you were prescribed an antibiotic medicine, take it as told by your doctor. Do not stop taking it even if you start to feel better. Hydrate and humidify  Drink enough water to keep your pee (urine) pale yellow. Use a cool mist humidifier to keep the humidity level in your home above 50%. Breathe in steam for 10-15 minutes, 3-4 times a day, or as told by your doctor. You can do this in the bathroom while a hot shower is running. Try not to spend time in cool or dry air. Rest Rest as much as you can. Sleep with your head raised (elevated). Make sure you get enough sleep each night. General instructions  Put a warm, moist washcloth on your face 3-4 times a day, or as often as told by your doctor. Use  nasal saline washes as often as told by your doctor. Wash your hands often with soap and water. If you cannot use soap and water, use hand sanitizer. Do not smoke. Avoid being around people who are smoking (secondhand smoke). Keep all follow-up visits. Contact a doctor if: You have a fever. Your symptoms get worse. Your symptoms do not get better within 10 days. Get help right away if: You have a very bad headache. You cannot stop vomiting. You have very bad pain or swelling around your face or eyes. You have trouble seeing. You feel confused. Your neck is stiff. You have trouble breathing. These symptoms may be an emergency. Get help right away. Call 911. Do not wait to see if the symptoms will go away. Do not drive yourself to the hospital. Summary A sinus infection is swelling of your sinuses. Sinuses are hollow spaces in the bones around your face. This condition is caused by tissues in your nose  that become inflamed or swollen. This traps germs. These can lead to infection. If you were prescribed an antibiotic medicine, take it as told by your doctor. Do not stop taking it even if you start to feel better. Keep all follow-up visits. This information is not intended to replace advice given to you by your health care provider. Make sure you discuss any questions you have with your health care provider. Document Revised: 08/04/2021 Document Reviewed: 08/04/2021 Elsevier Patient Education  [employer].    If you have been instructed to have an in-person evaluation today at a local Urgent Care facility, please use the link below. It will take you to a list of all of our available [facility], including address, phone number and hours of operation. Please do not delay care.  [facility]  If you or a family member do not have a primary care provider, use the link below to schedule a visit and establish care. When you choose a [facility] primary care  physician or advanced practice provider, you gain a long-term partner in health. Find a Primary Care Provider  Learn more about 's in-office and virtual care options: [facility]

## 2022-07-07 ENCOUNTER — Telehealth: Payer: Self-pay | Admitting: Family Medicine

## 2022-07-07 DIAGNOSIS — G8929 Other chronic pain: Secondary | ICD-10-CM

## 2022-07-07 DIAGNOSIS — R5383 Other fatigue: Secondary | ICD-10-CM

## 2022-07-07 NOTE — Patient Instructions (Signed)
.    NOTE: You will NOT be charged for this eVisit.  If you do not have a PCP, [facility] offers a free physician referral service available at [phone]. Our trained staff has the experience, knowledge and resources to put you in touch with a physician who is right for you.    If you are having a true medical emergency please call 911.   Your e-visit answers were reviewed by a board certified advanced clinical practitioner to complete your personal care plan.  Thank you for using e-Visits.

## 2022-07-07 NOTE — Progress Notes (Signed)
[place]   Needs to be seen in person for on going covid symptoms and spine/back pain related to previous injury at work.  Patient acknowledged agreement and understanding of the plan.

## 2022-11-12 IMAGING — US US ABDOMEN LIMITED RUQ/ASCITES
1 series · 14 of 25 positions shown · non-contrast
Comparison: None.

CLINICAL DATA: Abnormal liver function test.

EXAM:
ULTRASOUND ABDOMEN LIMITED RIGHT UPPER QUADRANT

[Series 1: us abdomen limited ruq/ascites · 14 of 28 slices shown]
[im 1/28]
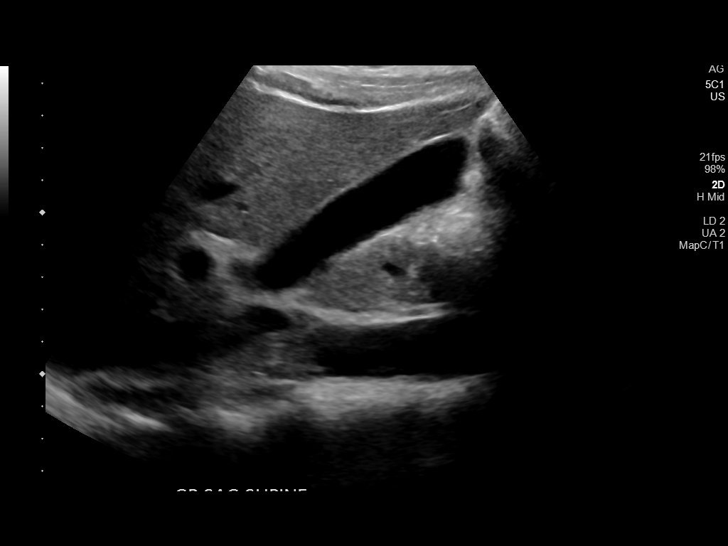
[im 3/28]
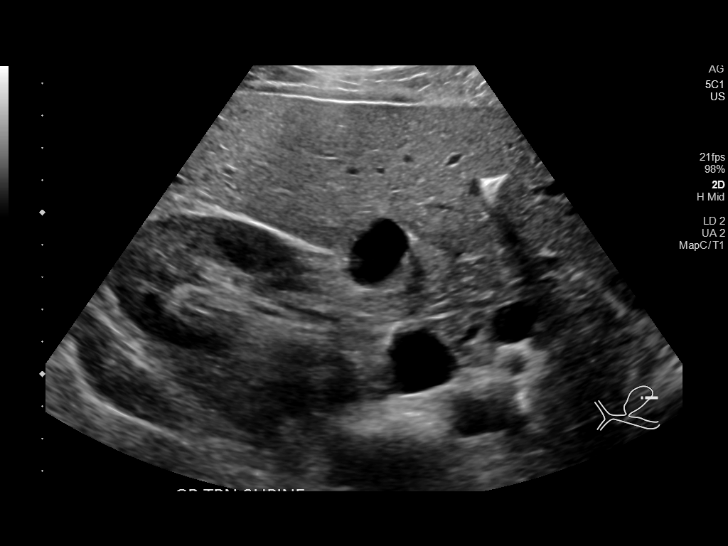
[im 5/28]
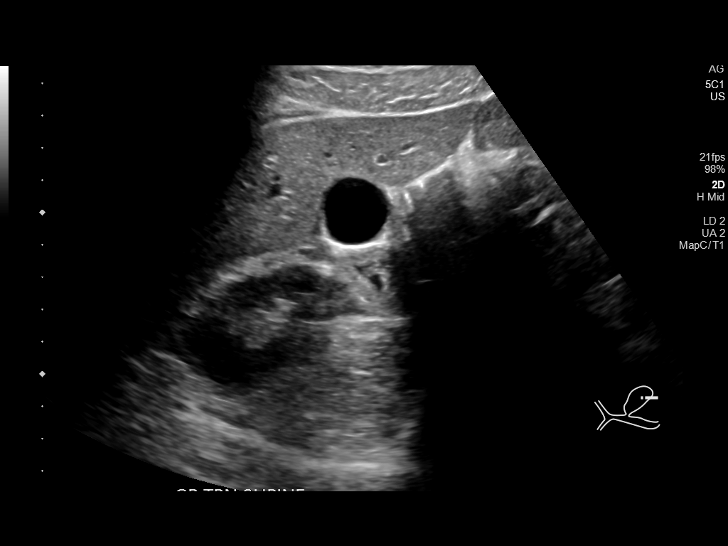
[im 7/28]
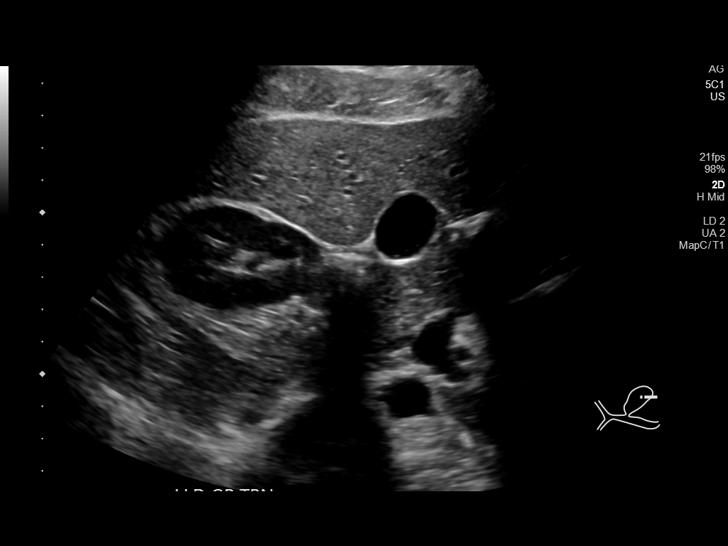
[im 10/28]
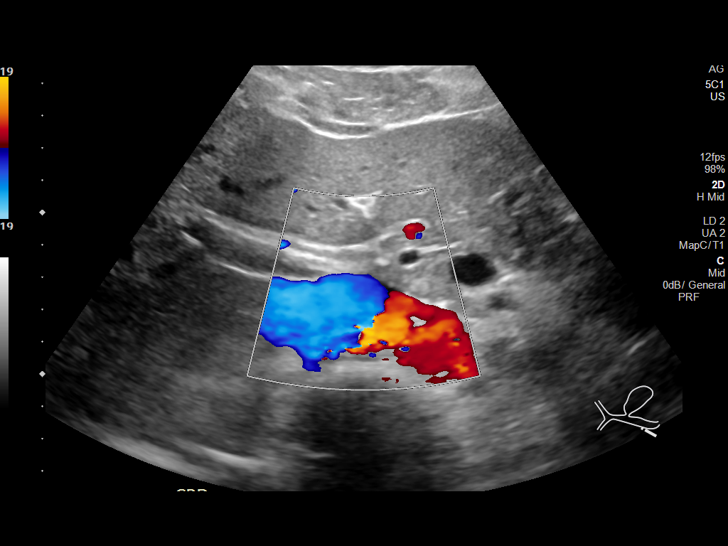
[im 11/28]
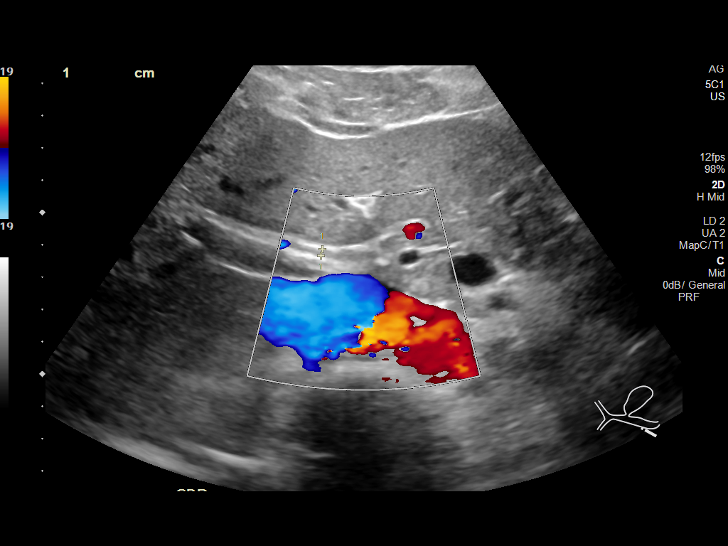
[im 13/28]
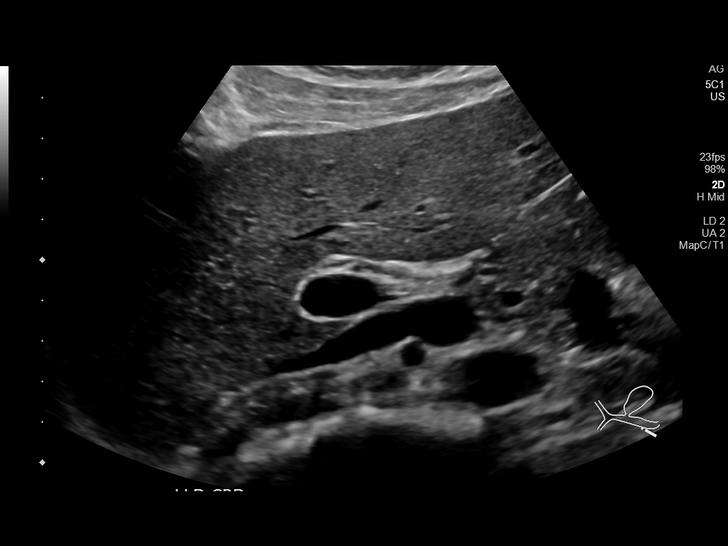
[im 15/28]
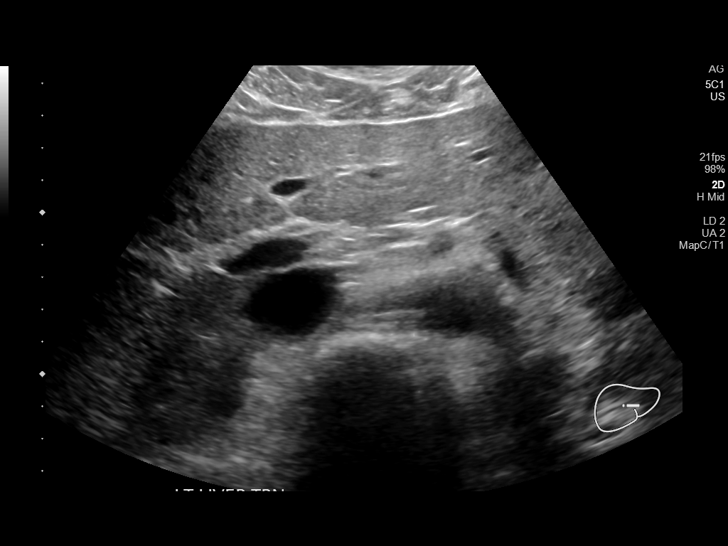
[im 17/28]
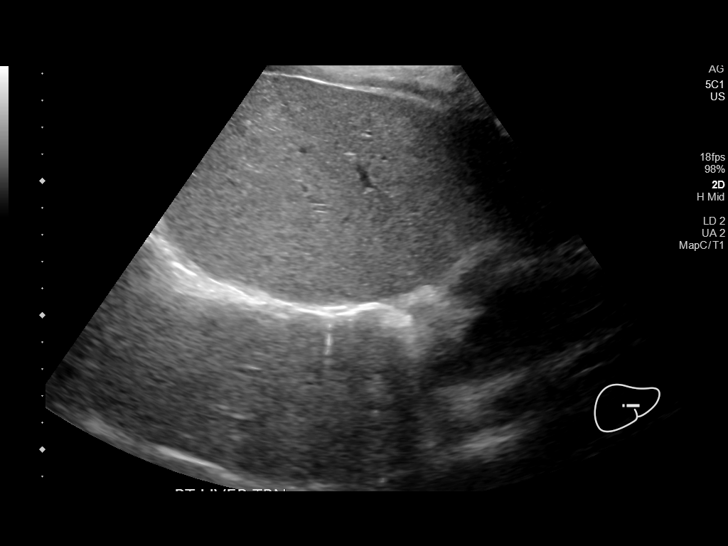
[im 19/28]
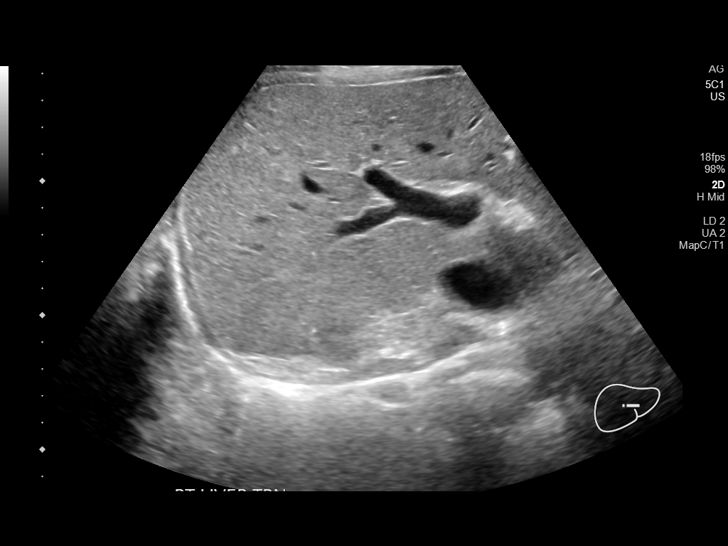
[im 21/28]
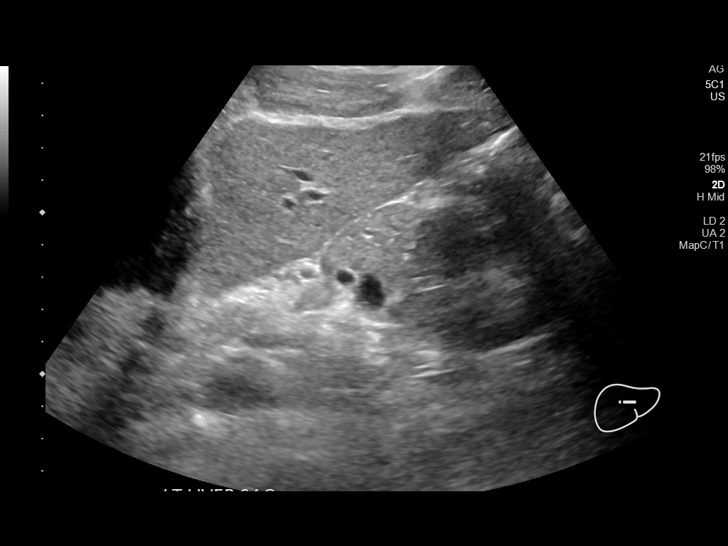
[im 23/28]
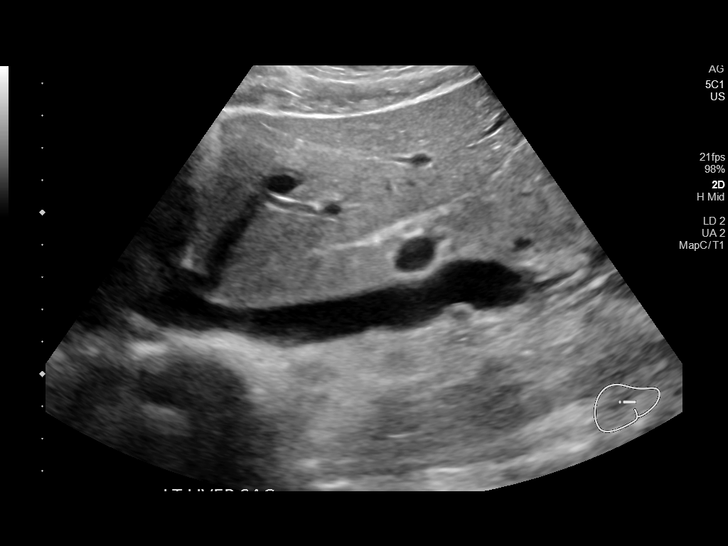
[im 25/28]
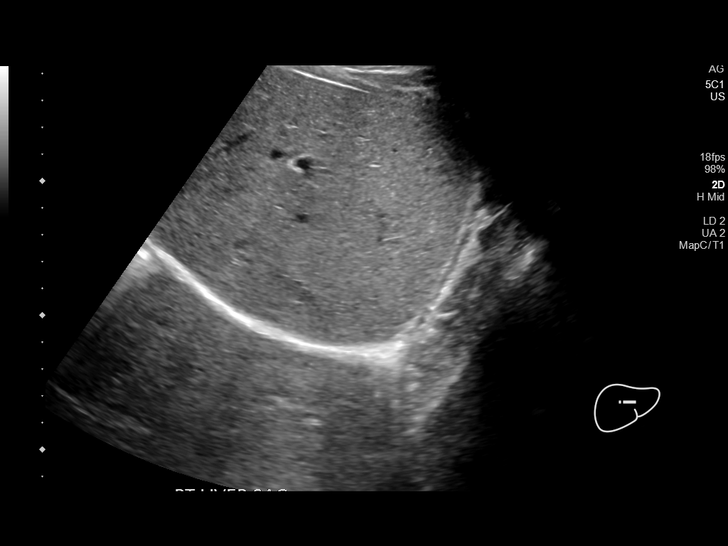
[im 28/28]
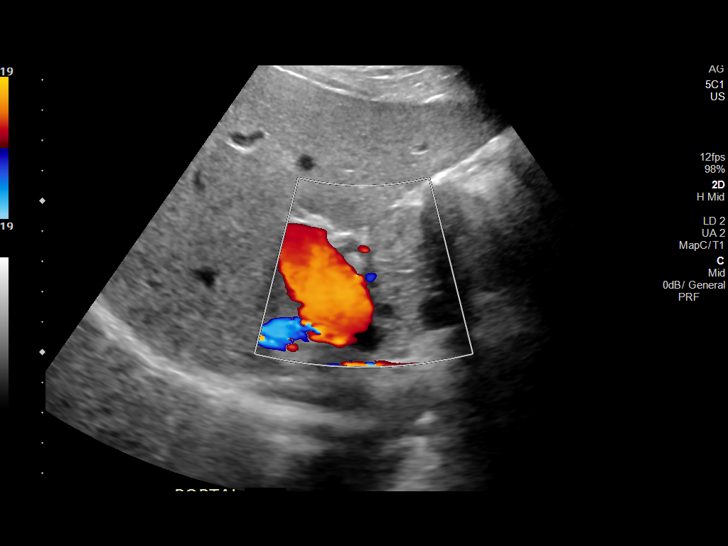

[14 of 25 positions shown; findings below may reference images not displayed]

FINDINGS: Gallbladder:

No gallstones or wall thickening visualized (1.9 mm). No sonographic
Murphy sign noted by sonographer.

Common bile duct:

Diameter: 2.0 mm

Liver:

No focal lesion identified. Within normal limits in parenchymal
echogenicity. Portal vein is patent on color Doppler imaging with
normal direction of blood flow towards the liver.

Other: None.
IMPRESSION: Normal right upper quadrant ultrasound.

## 2024-09-04 ENCOUNTER — Encounter (HOSPITAL_COMMUNITY): Payer: Self-pay

## 2024-09-04 ENCOUNTER — Emergency Department (HOSPITAL_COMMUNITY)

## 2024-09-04 ENCOUNTER — Other Ambulatory Visit: Payer: Self-pay

## 2024-09-04 ENCOUNTER — Emergency Department (HOSPITAL_COMMUNITY)
Admission: EM | Admit: 2024-09-04 | Discharge: 2024-09-04 | Disposition: A | Discharge status: Home / Self Care | Attending: Emergency Medicine | Admitting: Emergency Medicine

## 2024-09-04 DIAGNOSIS — Z7901 Long term (current) use of anticoagulants: Secondary | ICD-10-CM | POA: Diagnosis not present

## 2024-09-04 DIAGNOSIS — R10A2 Flank pain, left side: Secondary | ICD-10-CM | POA: Diagnosis present

## 2024-09-04 DIAGNOSIS — N132 Hydronephrosis with renal and ureteral calculous obstruction: Secondary | ICD-10-CM | POA: Diagnosis not present

## 2024-09-04 DIAGNOSIS — N200 Calculus of kidney: Secondary | ICD-10-CM

## 2024-09-04 LAB — URINALYSIS, ROUTINE W REFLEX MICROSCOPIC
Bilirubin Urine: NEGATIVE
Glucose, UA: NEGATIVE mg/dL
Ketones, ur: NEGATIVE mg/dL
Leukocytes,Ua: NEGATIVE
Nitrite: NEGATIVE
Protein, ur: 100 mg/dL — AB
Specific Gravity, Urine: 1.026 (ref 1.005–1.030)
pH: 6 (ref 5.0–8.0)

## 2024-09-04 LAB — CBC
HCT: 42 % (ref 39.0–52.0)
Hemoglobin: 14.6 g/dL (ref 13.0–17.0)
MCH: 31.3 pg (ref 26.0–34.0)
MCHC: 34.8 g/dL (ref 30.0–36.0)
MCV: 90.1 fL (ref 80.0–100.0)
Platelets: 350 K/uL (ref 150–400)
RBC: 4.66 MIL/uL (ref 4.22–5.81)
RDW: 12 % (ref 11.5–15.5)
WBC: 4.5 K/uL (ref 4.0–10.5)
nRBC: 0 % (ref 0.0–0.2)

## 2024-09-04 LAB — BASIC METABOLIC PANEL WITH GFR
Anion gap: 10 (ref 5–15)
BUN: 14 mg/dL (ref 6–20)
CO2: 25 mmol/L (ref 22–32)
Calcium: 9 mg/dL (ref 8.9–10.3)
Chloride: 103 mmol/L (ref 98–111)
Creatinine, Ser: 1.07 mg/dL (ref 0.61–1.24)
GFR, Estimated: >60 mL/min
Glucose, Bld: 111 mg/dL — ABNORMAL HIGH (ref 70–99)
Potassium: 4 mmol/L (ref 3.5–5.1)
Sodium: 137 mmol/L (ref 135–145)

## 2024-09-04 MED ORDER — LORAZEPAM 1 MG PO TABS
2.0000 mg | ORAL_TABLET | Freq: Once | ORAL | Status: AC
  Administered 2024-09-04: 2 mg via ORAL
  Filled 2024-09-04: qty 2

## 2024-09-04 MED ORDER — KETOROLAC TROMETHAMINE 15 MG/ML IJ SOLN
15.0000 mg | Freq: Once | INTRAMUSCULAR | Status: AC
  Administered 2024-09-04: 15 mg via INTRAVENOUS
  Filled 2024-09-04: qty 1

## 2024-09-04 MED ORDER — LIDOCAINE 5 % EX PTCH
2.0000 | MEDICATED_PATCH | CUTANEOUS | Status: DC
  Administered 2024-09-04: 2 via TRANSDERMAL
  Filled 2024-09-04: qty 2

## 2024-09-04 MED ORDER — OXYCODONE-ACETAMINOPHEN 5-325 MG PO TABS: 1.0000 | ORAL_TABLET | Freq: Four times a day (QID) | ORAL | 0 refills | Status: AC | PRN

## 2024-09-04 MED ORDER — FENTANYL CITRATE (PF) 50 MCG/ML IJ SOSY
50.0000 ug | PREFILLED_SYRINGE | Freq: Once | INTRAMUSCULAR | Status: AC
  Administered 2024-09-04: 50 ug via INTRAVENOUS
  Filled 2024-09-04: qty 1

## 2024-09-04 MED ORDER — LORAZEPAM 1 MG PO TABS: 1.0000 mg | ORAL_TABLET | Freq: Once | ORAL | Status: DC

## 2024-09-04 NOTE — ED Provider Notes (Signed)
 " [place] [facility] DEPARTMENT AT [facility] Provider Note   CSN: [id number] Arrival date & time: 09/04/24  1019     Patient presents with: Back Pain   ARISTON GRANDISON is a 38 y.o. male.    Back Pain 38 year old male presenting today with back pain and left-sided flank pain.  Patient states that this started this morning.  He endorses a history of previous spinal pain.  He says that he hurt it about 6 or 7 years ago and has been needing to get with a surgeon since then but has not done that.  He reports that the pain feels the same as whenever he first was injured.  He denies any numbness or tingling in his extremities.  He is able to walk.  He reports that the majority of his pain is on his left flank.     Prior to Admission medications  Medication Sig Start Date End Date Taking? Authorizing Provider  apixaban  (ELIQUIS ) 5 MG TABS tablet Take 2 tablets (10mg ) twice daily for 7 days, then 1 tablet (5mg ) twice daily 08/12/21   Redwine, Madison A, PA-C  BEE POLLEN PO Take 5 mLs by mouth daily.    [provider]  famotidine  (PEPCID ) 20 MG tablet Take 1 tablet (20 mg total) by mouth 2 (two) times daily. 03/30/22   Vicky Charleston, PA-C  hydrocortisone  2.5 % lotion Apply topically 2 (two) times daily. 04/05/22   Henderly, Britni A, PA-C  hydrOXYzine  (ATARAX ) 25 MG tablet Take 1 tablet (25 mg total) by mouth every 6 (six) hours. 04/05/22   Henderly, Britni A, PA-C  Multiple Vitamin (MULTIVITAMIN PO) Take 1 capsule by mouth in the morning and at bedtime. Mervin Masters, [place], [place]    [provider]  Omega-3 Fatty Acids (FISH OIL) 1000 MG CAPS Take 2 capsules by mouth daily.    [provider]  ondansetron  (ZOFRAN -ODT) 4 MG disintegrating tablet Take 2 tablets (8 mg total) by mouth every 8 (eight) hours as needed for nausea or vomiting. 04/22/22   Gladis Elsie BROCKS, PA-C  Probiotic Product (PROBIOTIC PO) Take 2 capsules by mouth daily.     [provider]    Allergies: Hydrocodone    Review of Systems  Musculoskeletal:  Positive for back pain.  All other systems reviewed and are negative.   Updated Vital Signs BP (!) 143/95   Pulse 84   Temp 98.2 F (36.8 C)   Resp 20   SpO2 98%   Physical Exam Vitals and nursing note reviewed.  HENT:     Mouth/Throat:     Pharynx: Oropharynx is clear.  Cardiovascular:     Rate and Rhythm: Normal rate.     Pulses: Normal pulses.  Pulmonary:     Effort: Pulmonary effort is normal.     Breath sounds: Normal breath sounds.  Abdominal:     General: Abdomen is flat. Bowel sounds are normal.     Palpations: Abdomen is soft.  Musculoskeletal:     Cervical back: Normal.     Thoracic back: Normal.     Lumbar back: Normal.     Comments: No swelling or tenderness to palpation.  Skin:    General: Skin is warm and dry.  Neurological:     General: No focal deficit present.     Mental Status: He is alert.     GCS: GCS eye subscore is 4. GCS verbal subscore is 5. GCS motor subscore is 6.  Sensory: Sensation is intact.     Motor: Motor function is intact.     Coordination: Coordination is intact.     Gait: Gait is intact.     (all labs ordered are listed, but only abnormal results are displayed) Labs Reviewed  BASIC METABOLIC PANEL WITH GFR - Abnormal; Notable for the following components:      Result Value   Glucose, Bld 111 (*)    All other components within normal limits  CBC  URINALYSIS, ROUTINE W REFLEX MICROSCOPIC    EKG: None  Radiology: No results found.   Procedures   Medications Ordered in the ED  fentaNYL  (SUBLIMAZE ) injection 50 mcg (50 mcg Intravenous Given 09/04/24 1118)                                    Medical Decision Making Amount and/or Complexity of Data Reviewed Labs: ordered. Radiology: ordered.  Risk Prescription drug management.   Impression: 38 year old male presenting with back pain.  Original diagnoses include  muscle strain, kidney stone, fracture, AAA  Additional History: Patient was able to give history.  I also reviewed other outpatient notes.  Labs: CBC and CMP showed no abnormalities.  I considered ordering hepatic function and lipase however after CT returned with the diagnosis of kidney stones I believe that liver and pancreas causes of this pain are less likely.  Imaging: CT of the renal showed a 2 mm renal stone in the bladder lumen.  Radiologist also saw 1 cm simple hepatic cyst. I reviewed the images and agree with radiologist.  ED Course/Meds: Patient received Ativan , Toradol , fentanyl  while in the ER to help with pain.  He reports that after receiving this his pain subsided.  On CT it was shown that he had a renal stone in the bladder.  It is not known whether the medication helped or the fact that the kidney stone passed into the bladder was what relieved his pain.  Patient remained stable while in the ER.  I educated on when to return to the ER.  Pain medication was sent to his pharmacy to help with the passing of the kidney stone.      Final diagnoses:  None    ED Discharge Orders     None          Rosaline Almarie KANDICE DEVONNA 09/04/24 1509    Garrick Charleston, MD 09/05/24 [phone]  "

## 2024-09-04 NOTE — ED Triage Notes (Addendum)
 PT arrives via EMS from home. Pt reports he has hx of chronic back pain from a spinal injury. PT states his lower back has been hurting for the past 3 days, thinks he may have slept the wrong way on it. Denies injury. Reports new left lower back pain that radiates to left lower abdomin. PT reports dysuria for the past couple of days. CMS is intact.  EMS administered 100mcg of Fentanyl  and 4mg  of Zofran .

## 2024-09-04 NOTE — Discharge Instructions (Addendum)
 You had a 2mm kidney stone located in your ureter. Strain all urine with a strainer that was provided to you.  Make sure you drink plenty of water to maintain hydration.  Please use Tylenol  or ibuprofen  for pain.  You may use 600 mg ibuprofen  every 6 hours or 1000 mg of Tylenol  every 6 hours.  You may choose to alternate between the 2.  This would be most effective.  Not to exceed 4 g of Tylenol  within 24 hours.  Not to exceed 3200 mg ibuprofen  24 hours.   I have prescribed or offered you other medicine for breakthrough pain. Notably there is a small amount of Tylenol --specifically 325 mg--in each dose of Percocet.  If you do take a dose of Percocet please take a 500 mg instead of the 1000 mg dose of Tylenol .  Follow up with [facility] I have given you the information for their office.  Generally kidney stones pass on their own given time in the focus of treatment is minimizing pain  I have provided you follow-up information with both the orthopedist and a family medicine doctor.  I would recommend following up with them for any further issues that you have.

## 2024-09-04 NOTE — ED Notes (Signed)
 Provided patient with 2 more hot packs
